# Patient Record
Sex: Female | Born: 1935 | Race: Black or African American | Hispanic: No | Marital: Single | Smoking: Never smoker
Health system: Southern US, Community
[De-identification: ages and names within clinical notes are randomized; demographics above are authoritative.]

## PROBLEM LIST (undated history)

## (undated) DIAGNOSIS — M1990 Unspecified osteoarthritis, unspecified site: Secondary | ICD-10-CM

## (undated) DIAGNOSIS — N289 Disorder of kidney and ureter, unspecified: Secondary | ICD-10-CM

## (undated) DIAGNOSIS — I10 Essential (primary) hypertension: Secondary | ICD-10-CM

## (undated) DIAGNOSIS — E119 Type 2 diabetes mellitus without complications: Secondary | ICD-10-CM

## (undated) HISTORY — DX: Unspecified osteoarthritis, unspecified site: M19.90

## (undated) HISTORY — DX: Essential (primary) hypertension: I10

## (undated) HISTORY — DX: Disorder of kidney and ureter, unspecified: N28.9

## (undated) HISTORY — PX: TUMOR REMOVAL: SHX12

## (undated) HISTORY — DX: Type 2 diabetes mellitus without complications: E11.9

---

## 1998-04-07 ENCOUNTER — Encounter: Payer: Self-pay | Admitting: Emergency Medicine

## 1998-04-07 ENCOUNTER — Emergency Department (HOSPITAL_COMMUNITY): Admission: EM | Admit: 1998-04-07 | Discharge: 1998-04-07 | Payer: Self-pay | Admitting: Emergency Medicine

## 1998-10-18 ENCOUNTER — Emergency Department (HOSPITAL_COMMUNITY): Admission: EM | Admit: 1998-10-18 | Discharge: 1998-10-18 | Payer: Self-pay | Admitting: Emergency Medicine

## 1998-10-18 ENCOUNTER — Encounter: Payer: Self-pay | Admitting: Emergency Medicine

## 1999-01-26 ENCOUNTER — Emergency Department (HOSPITAL_COMMUNITY): Admission: EM | Admit: 1999-01-26 | Discharge: 1999-01-26 | Payer: Self-pay | Admitting: Emergency Medicine

## 1999-02-28 ENCOUNTER — Ambulatory Visit (HOSPITAL_COMMUNITY): Admission: RE | Admit: 1999-02-28 | Discharge: 1999-02-28 | Payer: Self-pay | Admitting: Internal Medicine

## 2000-03-03 ENCOUNTER — Ambulatory Visit (HOSPITAL_COMMUNITY): Admission: RE | Admit: 2000-03-03 | Discharge: 2000-03-03 | Payer: Self-pay | Admitting: Internal Medicine

## 2004-04-12 ENCOUNTER — Ambulatory Visit: Payer: Self-pay | Admitting: Internal Medicine

## 2004-05-09 ENCOUNTER — Ambulatory Visit: Payer: Self-pay | Admitting: Internal Medicine

## 2004-08-17 ENCOUNTER — Ambulatory Visit: Payer: Self-pay | Admitting: Internal Medicine

## 2004-08-31 ENCOUNTER — Ambulatory Visit: Payer: Self-pay | Admitting: Internal Medicine

## 2004-10-31 ENCOUNTER — Ambulatory Visit: Payer: Self-pay | Admitting: Internal Medicine

## 2005-02-08 ENCOUNTER — Ambulatory Visit: Payer: Self-pay | Admitting: Internal Medicine

## 2005-02-15 ENCOUNTER — Ambulatory Visit: Payer: Self-pay | Admitting: Internal Medicine

## 2006-01-02 ENCOUNTER — Ambulatory Visit: Payer: Self-pay | Admitting: Internal Medicine

## 2006-02-03 ENCOUNTER — Ambulatory Visit: Payer: Self-pay | Admitting: Pulmonary Disease

## 2006-05-08 ENCOUNTER — Ambulatory Visit: Payer: Self-pay | Admitting: Internal Medicine

## 2006-10-29 ENCOUNTER — Ambulatory Visit: Payer: Self-pay | Admitting: Internal Medicine

## 2006-10-29 LAB — CONVERTED CEMR LAB
ALT: 13 units/L (ref 0–40)
AST: 19 units/L (ref 0–37)
Albumin: 3.9 g/dL (ref 3.5–5.2)
Alkaline Phosphatase: 73 units/L (ref 39–117)
BUN: 29 mg/dL — ABNORMAL HIGH (ref 6–23)
Bilirubin, Direct: 0.1 mg/dL (ref 0.0–0.3)
CO2: 28 meq/L (ref 19–32)
Calcium: 9.7 mg/dL (ref 8.4–10.5)
Chloride: 103 meq/L (ref 96–112)
Creatinine, Ser: 1.5 mg/dL — ABNORMAL HIGH (ref 0.4–1.2)
GFR calc Af Amer: 42 mL/min
GFR calc non Af Amer: 34 mL/min
Glucose, Bld: 150 mg/dL — ABNORMAL HIGH (ref 70–99)
Hgb A1c MFr Bld: 6.9 % — ABNORMAL HIGH (ref 4.6–6.0)
Potassium: 3.6 meq/L (ref 3.5–5.1)
Pro B Natriuretic peptide (BNP): 92 pg/mL (ref 0.0–100.0)
Sodium: 140 meq/L (ref 135–145)
TSH: 2.65 microintl units/mL (ref 0.35–5.50)
Total Bilirubin: 0.6 mg/dL (ref 0.3–1.2)
Total Protein: 7.6 g/dL (ref 6.0–8.3)

## 2007-01-15 ENCOUNTER — Ambulatory Visit: Payer: Self-pay | Admitting: Internal Medicine

## 2007-06-12 DIAGNOSIS — E119 Type 2 diabetes mellitus without complications: Secondary | ICD-10-CM

## 2007-06-12 DIAGNOSIS — N259 Disorder resulting from impaired renal tubular function, unspecified: Secondary | ICD-10-CM | POA: Insufficient documentation

## 2007-06-12 DIAGNOSIS — I10 Essential (primary) hypertension: Secondary | ICD-10-CM | POA: Insufficient documentation

## 2007-08-26 ENCOUNTER — Encounter: Payer: Self-pay | Admitting: Internal Medicine

## 2007-10-12 ENCOUNTER — Encounter: Admission: RE | Admit: 2007-10-12 | Discharge: 2007-10-12 | Payer: Self-pay | Admitting: Orthopedic Surgery

## 2007-10-21 ENCOUNTER — Encounter: Payer: Self-pay | Admitting: Internal Medicine

## 2007-10-23 ENCOUNTER — Encounter: Payer: Self-pay | Admitting: Internal Medicine

## 2007-11-17 ENCOUNTER — Ambulatory Visit: Payer: Self-pay | Admitting: Internal Medicine

## 2007-11-17 DIAGNOSIS — R609 Edema, unspecified: Secondary | ICD-10-CM

## 2007-11-17 DIAGNOSIS — M1990 Unspecified osteoarthritis, unspecified site: Secondary | ICD-10-CM | POA: Insufficient documentation

## 2007-11-18 LAB — CONVERTED CEMR LAB
BUN: 32 mg/dL — ABNORMAL HIGH (ref 6–23)
Basophils Absolute: 0 10*3/uL (ref 0.0–0.1)
Basophils Relative: 0.1 % (ref 0.0–1.0)
CO2: 27 meq/L (ref 19–32)
Calcium: 9.8 mg/dL (ref 8.4–10.5)
Chloride: 103 meq/L (ref 96–112)
Creatinine, Ser: 1.3 mg/dL — ABNORMAL HIGH (ref 0.4–1.2)
Eosinophils Absolute: 0.1 10*3/uL (ref 0.0–0.7)
Eosinophils Relative: 1.5 % (ref 0.0–5.0)
GFR calc Af Amer: 49 mL/min
GFR calc non Af Amer: 41 mL/min
Glucose, Bld: 170 mg/dL — ABNORMAL HIGH (ref 70–99)
HCT: 36.1 % (ref 36.0–46.0)
Hemoglobin: 12.2 g/dL (ref 12.0–15.0)
Hgb A1c MFr Bld: 6.9 % — ABNORMAL HIGH (ref 4.6–6.0)
Lymphocytes Relative: 25.4 % (ref 12.0–46.0)
MCHC: 33.8 g/dL (ref 30.0–36.0)
MCV: 90.4 fL (ref 78.0–100.0)
Monocytes Absolute: 0.4 10*3/uL (ref 0.1–1.0)
Monocytes Relative: 5.8 % (ref 3.0–12.0)
Neutro Abs: 4.3 10*3/uL (ref 1.4–7.7)
Neutrophils Relative %: 67.2 % (ref 43.0–77.0)
Platelets: 231 10*3/uL (ref 150–400)
Potassium: 3.9 meq/L (ref 3.5–5.1)
Pro B Natriuretic peptide (BNP): 64 pg/mL (ref 0.0–100.0)
RBC: 3.99 M/uL (ref 3.87–5.11)
RDW: 13.8 % (ref 11.5–14.6)
Sodium: 140 meq/L (ref 135–145)
TSH: 2.41 microintl units/mL (ref 0.35–5.50)
WBC: 6.4 10*3/uL (ref 4.5–10.5)

## 2007-11-24 ENCOUNTER — Encounter: Payer: Self-pay | Admitting: Internal Medicine

## 2007-12-16 ENCOUNTER — Encounter: Payer: Self-pay | Admitting: Internal Medicine

## 2008-01-10 ENCOUNTER — Encounter: Payer: Self-pay | Admitting: Internal Medicine

## 2008-01-13 ENCOUNTER — Telehealth (INDEPENDENT_AMBULATORY_CARE_PROVIDER_SITE_OTHER): Payer: Self-pay | Admitting: *Deleted

## 2008-01-20 ENCOUNTER — Encounter: Payer: Self-pay | Admitting: Internal Medicine

## 2008-03-03 ENCOUNTER — Telehealth (INDEPENDENT_AMBULATORY_CARE_PROVIDER_SITE_OTHER): Payer: Self-pay | Admitting: *Deleted

## 2008-03-16 ENCOUNTER — Encounter: Payer: Self-pay | Admitting: Internal Medicine

## 2008-05-17 ENCOUNTER — Encounter: Payer: Self-pay | Admitting: Internal Medicine

## 2008-07-20 ENCOUNTER — Encounter: Payer: Self-pay | Admitting: Internal Medicine

## 2008-08-23 ENCOUNTER — Telehealth (INDEPENDENT_AMBULATORY_CARE_PROVIDER_SITE_OTHER): Payer: Self-pay | Admitting: *Deleted

## 2008-08-23 ENCOUNTER — Ambulatory Visit: Payer: Self-pay | Admitting: Internal Medicine

## 2008-08-23 DIAGNOSIS — M2550 Pain in unspecified joint: Secondary | ICD-10-CM | POA: Insufficient documentation

## 2008-08-24 ENCOUNTER — Encounter: Payer: Self-pay | Admitting: Internal Medicine

## 2008-08-24 LAB — CONVERTED CEMR LAB
BUN: 19 mg/dL (ref 6–23)
Basophils Relative: 3 % (ref 0.0–3.0)
CO2: 28 meq/L (ref 19–32)
Chloride: 106 meq/L (ref 96–112)
Creatinine, Ser: 1.3 mg/dL — ABNORMAL HIGH (ref 0.4–1.2)
Eosinophils Absolute: 0 10*3/uL (ref 0.0–0.7)
Lymphs Abs: 1.6 10*3/uL (ref 0.7–4.0)
MCHC: 34.3 g/dL (ref 30.0–36.0)
MCV: 92.4 fL (ref 78.0–100.0)
Monocytes Absolute: 0.2 10*3/uL (ref 0.1–1.0)
Neutrophils Relative %: 60.2 % (ref 43.0–77.0)
RBC: 4.02 M/uL (ref 3.87–5.11)
Sed Rate: 28 mm/hr — ABNORMAL HIGH (ref 0–22)

## 2008-09-03 ENCOUNTER — Encounter: Payer: Self-pay | Admitting: Internal Medicine

## 2008-09-13 ENCOUNTER — Encounter: Payer: Self-pay | Admitting: Internal Medicine

## 2008-09-30 ENCOUNTER — Encounter: Payer: Self-pay | Admitting: Internal Medicine

## 2008-10-06 ENCOUNTER — Telehealth (INDEPENDENT_AMBULATORY_CARE_PROVIDER_SITE_OTHER): Payer: Self-pay | Admitting: *Deleted

## 2008-10-11 ENCOUNTER — Telehealth: Payer: Self-pay | Admitting: Internal Medicine

## 2008-10-12 ENCOUNTER — Ambulatory Visit: Payer: Self-pay | Admitting: Internal Medicine

## 2008-10-13 LAB — CONVERTED CEMR LAB
BUN: 30 mg/dL — ABNORMAL HIGH (ref 6–23)
Basophils Absolute: 0 10*3/uL (ref 0.0–0.1)
Basophils Relative: 0.1 % (ref 0.0–3.0)
CO2: 28 meq/L (ref 19–32)
Chloride: 105 meq/L (ref 96–112)
Eosinophils Absolute: 0 10*3/uL (ref 0.0–0.7)
Glucose, Bld: 132 mg/dL — ABNORMAL HIGH (ref 70–99)
HCT: 35.5 % — ABNORMAL LOW (ref 36.0–46.0)
Hemoglobin: 12.2 g/dL (ref 12.0–15.0)
Lymphs Abs: 0.8 10*3/uL (ref 0.7–4.0)
MCHC: 34.3 g/dL (ref 30.0–36.0)
MCV: 94.3 fL (ref 78.0–100.0)
Neutro Abs: 7.2 10*3/uL (ref 1.4–7.7)
Potassium: 4.5 meq/L (ref 3.5–5.1)
RDW: 14.9 % — ABNORMAL HIGH (ref 11.5–14.6)

## 2008-10-25 ENCOUNTER — Encounter: Payer: Self-pay | Admitting: Internal Medicine

## 2008-11-14 ENCOUNTER — Telehealth (INDEPENDENT_AMBULATORY_CARE_PROVIDER_SITE_OTHER): Payer: Self-pay | Admitting: *Deleted

## 2008-11-24 ENCOUNTER — Encounter: Payer: Self-pay | Admitting: Internal Medicine

## 2009-01-02 ENCOUNTER — Telehealth (INDEPENDENT_AMBULATORY_CARE_PROVIDER_SITE_OTHER): Payer: Self-pay | Admitting: *Deleted

## 2009-01-06 ENCOUNTER — Encounter: Payer: Self-pay | Admitting: Internal Medicine

## 2009-03-17 ENCOUNTER — Encounter: Payer: Self-pay | Admitting: Critical Care Medicine

## 2009-04-28 ENCOUNTER — Telehealth: Payer: Self-pay | Admitting: Internal Medicine

## 2009-05-04 ENCOUNTER — Inpatient Hospital Stay (HOSPITAL_COMMUNITY): Admission: EM | Admit: 2009-05-04 | Discharge: 2009-05-08 | Payer: Self-pay | Admitting: Emergency Medicine

## 2009-07-28 ENCOUNTER — Telehealth (INDEPENDENT_AMBULATORY_CARE_PROVIDER_SITE_OTHER): Payer: Self-pay | Admitting: *Deleted

## 2010-06-26 NOTE — Progress Notes (Signed)
Summary: ORDER  Phone Note Call from Patient Call back at [phone]   Caller: Daughter-BESSIE Call For: WERT Summary of Call: PT NEED ORDER TO HAVE WHEEL CHAIR PICKED UP PT IN THE HOSPITAL Initial call taken by: Rickard Patience,  July 28, 2009 3:29 PM  Follow-up for Phone Call        PT'S DAUGHTER THOUGHT THE CHAIR WAS A RENTAL EXPLAINED TO HER THIS IS A PURCHASE BILLED TO [facility] OVER SEVERAL MONTHS--ADVISED HER TO KEEP THE CHAIR Follow-up by: Philipp Deputy CMA,  July 28, 2009 5:23 PM

## 2010-08-28 LAB — BASIC METABOLIC PANEL
CO2: 20 mEq/L (ref 19–32)
CO2: 23 mEq/L (ref 19–32)
Chloride: 108 mEq/L (ref 96–112)
Chloride: 108 mEq/L (ref 96–112)
Chloride: 116 mEq/L — ABNORMAL HIGH (ref 96–112)
Creatinine, Ser: 1.05 mg/dL (ref 0.4–1.2)
GFR calc Af Amer: 39 mL/min — ABNORMAL LOW (ref >60)
GFR calc Af Amer: 59 mL/min — ABNORMAL LOW (ref >60)
GFR calc non Af Amer: 33 mL/min — ABNORMAL LOW (ref >60)
Glucose, Bld: 139 mg/dL — ABNORMAL HIGH (ref 70–99)
Potassium: 3.7 mEq/L (ref 3.5–5.1)
Sodium: 140 mEq/L (ref 135–145)
Sodium: 140 mEq/L (ref 135–145)

## 2010-08-28 LAB — CBC
HCT: 39.7 % (ref 36.0–46.0)
HCT: 41.5 % (ref 36.0–46.0)
Hemoglobin: 13.2 g/dL (ref 12.0–15.0)
MCHC: 33.3 g/dL (ref 30.0–36.0)
MCHC: 33.8 g/dL (ref 30.0–36.0)
MCV: 93 fL (ref 78.0–100.0)
MCV: 94.3 fL (ref 78.0–100.0)
MCV: 94.9 fL (ref 78.0–100.0)
Platelets: 280 10*3/uL (ref 150–400)
RBC: 3.69 MIL/uL — ABNORMAL LOW (ref 3.87–5.11)
RBC: 4.21 MIL/uL (ref 3.87–5.11)
RDW: 14.1 % (ref 11.5–15.5)
WBC: 7.6 10*3/uL (ref 4.0–10.5)

## 2010-08-28 LAB — COMPREHENSIVE METABOLIC PANEL
ALT: 16 U/L (ref 0–35)
BUN: 53 mg/dL — ABNORMAL HIGH (ref 6–23)
CO2: 21 mEq/L (ref 19–32)
Calcium: 9.6 mg/dL (ref 8.4–10.5)
Creatinine, Ser: 1.53 mg/dL — ABNORMAL HIGH (ref 0.4–1.2)
GFR calc non Af Amer: 32 mL/min — ABNORMAL LOW (ref >60)
Glucose, Bld: 185 mg/dL — ABNORMAL HIGH (ref 70–99)
Sodium: 134 mEq/L — ABNORMAL LOW (ref 135–145)

## 2010-08-28 LAB — URINE CULTURE: Colony Count: >=100000

## 2010-08-28 LAB — GLUCOSE, CAPILLARY
Glucose-Capillary: 102 mg/dL — ABNORMAL HIGH (ref 70–99)
Glucose-Capillary: 113 mg/dL — ABNORMAL HIGH (ref 70–99)
Glucose-Capillary: 120 mg/dL — ABNORMAL HIGH (ref 70–99)
Glucose-Capillary: 124 mg/dL — ABNORMAL HIGH (ref 70–99)
Glucose-Capillary: 136 mg/dL — ABNORMAL HIGH (ref 70–99)
Glucose-Capillary: 139 mg/dL — ABNORMAL HIGH (ref 70–99)
Glucose-Capillary: 175 mg/dL — ABNORMAL HIGH (ref 70–99)

## 2010-08-28 LAB — URINALYSIS, ROUTINE W REFLEX MICROSCOPIC
Bilirubin Urine: NEGATIVE
Hgb urine dipstick: NEGATIVE
Ketones, ur: NEGATIVE mg/dL
Nitrite: NEGATIVE
Urobilinogen, UA: 0.2 mg/dL (ref 0.0–1.0)

## 2010-08-28 LAB — LIPASE, BLOOD: Lipase: 18 U/L (ref 11–59)

## 2010-08-28 LAB — DIFFERENTIAL
Basophils Absolute: 0 10*3/uL (ref 0.0–0.1)
Basophils Relative: 0 % (ref 0–1)
Eosinophils Absolute: 0 10*3/uL (ref 0.0–0.7)
Eosinophils Relative: 0 % (ref 0–5)
Monocytes Absolute: 0.2 10*3/uL (ref 0.1–1.0)

## 2010-08-28 LAB — HEMOGLOBIN A1C: Hgb A1c MFr Bld: 6.1 % (ref 4.6–6.1)

## 2010-08-28 LAB — URINE MICROSCOPIC-ADD ON

## 2010-08-28 LAB — LACTIC ACID, PLASMA: Lactic Acid, Venous: 2 mmol/L (ref 0.5–2.2)

## 2010-10-09 NOTE — Assessment & Plan Note (Signed)
[facility]                                 ON-CALL NOTE   ALECEA, TREGO                       MRN:          [id number]  DATE:10/11/2008                            DOB:          1911/12/17    PRIMARY CARE PHYSICIAN:  Casimiro Needle B. Sherene Sires, MD, FCCP   At approximately 1950 hours, Oct 11, 2008, Ms. Soyla Murphy, a  nurse's aide called and said that Ms. Keshana Klemz had inadvertently  taken an additional glyburide metformin tablet.  She had earlier had  some hypoglycemia and had been told to discontinue these medications.  At the present time, she was well.  The family was feeding her and they  wanted to make sure that they should continue with their planned meeting  or appointment with Dr. Sherene Sires, the following morning.  I instructed them  to continue to be vigilant for hypoglycemia.  If they have any  questions, call back or to take her to the emergency room and by all  means, follow up with her appointment.     Rogelia Rohrer, MD  Electronically Signed    Clemetine Marker  DD: 10/11/2008  DT: 10/12/2008  Job #: [id number]

## 2010-10-09 NOTE — Assessment & Plan Note (Signed)
[facility]                             PULMONARY OFFICE NOTE   CORDELL, COKE                       MRN:          [id number]  DATE:01/15/2007                            DOB:          [birth year]    PRIMARY SERVICE OFFICE VISIT   HISTORY:  A 77 year old black female with hypertension and diabetes in  for renewal of her FL2 for CAP.  She is living in her own home with an  aide who comes in 6 hours a day and a nurse who visits once a month.  Her daughter has been organizing her medicines into daily medicines in a  pill box that has only 1 box per day, but the patient is familiar with  which medicines she takes in the evening (Glucovance) and I reviewed  each and every one of these medications with her and her daughter  reconciling the bottles and the list, which is 100% correct in the  column dated January 15, 2007.   The daughter reports a fasting blood sugar typically between 100 and  120, and note that the patient had a hemoglobin A1c level in June of  this year of 6.9.  The patient denies any polyuria or polydipsia, chest  pain, dyspnea, orthopnea, PND, or leg swelling.   PHYSICAL EXAMINATION:  She is a wheelchair-bound pleasant black female  who lets her daughter do most of the talking for her.  VITAL SIGNS:  Stable vital signs.  HEENT:  Unremarkable.  Oropharynx is clear.  LUNGS:  The lung fields are clear bilaterally to auscultation and  percussion.  CARDIAC:  Regular rate and rhythm without murmur, gallop, or rub.  ABDOMEN:  Soft and benign.  EXTREMITIES:  Warm without calf tenderness, cyanosis, clubbing, or  edema.   IMPRESSION:  1. Diabetes appears to be under good control on her present regimen,      which I did not change.  I did recommend repeat fasting blood sugar      and a hemoglobin A1c in 3 months.  2. Hypertension well controlled on her present regimen.  3. General geriatric decline.  She appears well compensated in her     present home environment, and I have filled out all of the      paperwork for her forms for continued home care.     Charlaine Dalton. Sherene Sires, MD, [facility]  Electronically Signed    MBW/MedQ  DD: 01/15/2007  DT: 01/16/2007  Job #: [id number]

## 2010-10-09 NOTE — Assessment & Plan Note (Signed)
[facility]                             PULMONARY OFFICE NOTE   CLIFFIE, GINGRAS                       MRN:          [id number]  DATE:10/29/2006                            DOB:          02-Aug-1911    PRIMARY SERVICE/EXTENDED OFFICE VISIT:  A 77 year old black female with  hypertension and diabetes, in complaining of shoulders aching and leg  swelling.  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  The right is worse than the left.  She denies any jaw claudication or  other myalgias or arthralgias.  She denies any worsening with cough or  activity.   In addition, she has noticed increasing leg swelling over the last  several months.  She has apparently been taking Anacin for the shoulder  pain.  Denies using nonsteroidals.  She denies any orthopnea, PND, or  significant cough.   For a full inventory of medications, please see face sheet column dated  October 29, 2006, but I do not have confidence that she is actually using  the medications as instructed because she uses her medication pill box  but her caretaker is not correlating with the written instructions (see  comments below).   PHYSICAL EXAMINATION:  GENERAL:  She is an obese, wheelchair-bound black  female in no acute distress.  VITAL SIGNS:  Stable vital signs.  Blood pressure of 118/66.  HEENT:  Unremarkable.  NECK:  Clear.  CHEST:  Lung fields are clear bilaterally to auscultation and  percussion.  MUSCULOSKELETAL:  There is classic crepitus with pain elicited with  trying to abduct either arm beyond a 45-degree angle, especially on the  right.  She also had pain on external rotation.  CARDIAC:  Regular rate and rhythm without murmur, gallop, or rub.  ABDOMEN:  Soft, benign.  EXTREMITIES:  Warm without calf tenderness, cyanosis, or  clubbing.  There was 1+ pitting.   IMPRESSION:  1. Classic degenerative arthritis of the shoulders with crepitus and      restriction of the shoulder joints.  She does not have any evidence      at all of a systemic collagen vascular disease, and therefore I am      going to recommend at this point Tylenol p.r.n. and avoid all      nonsteroidals based on problem #2.  2. Diabetes, complicated by mild renal insufficiency with a creatinine      of 1.3.  We need to recheck a BMET today and avoid nonsteroidals      (except for perhaps Clinoril if the pain does not respond to      Tylenol) and/or refer her back to [facility], where she      has previously seen Dr. Montez Morita for similar problems.  3. Adult-onset diabetes mellitus.  We will check a hemoglobin A1c      today along with a nonfasting blood sugar.  4. Hypertension is well-controlled on her present regimen.  5. Peripheral edema if of unclear etiology.  We will check a BNP and      also an albumin and TSH level.   Overall I am concerned about this patent's state of decline and am not  confident that the caretaker that stays with her during the day has  enough insight into her medications to make sure she is not making  medication errors.  For that reason I have recommended that the patient  return with her pill box with all of her medications and with her  medication sheet and her caretaker at 3 months, sooner if needed.     Charlaine Dalton. Sherene Sires, MD, [facility]  Electronically Signed    MBW/MedQ  DD: 10/29/2006  DT: 10/29/2006  Job #: [id number]

## 2010-10-12 NOTE — Assessment & Plan Note (Signed)
[facility]                               PULMONARY OFFICE NOTE   JUSTICE, AGUIRRE                       MRN:          [id number]  DATE:01/02/2006                            DOB:          1911-12-03    HISTORY:  A 77 year old black female with  hypertension and diabetes, who  has not been seen in the office in almost a year.  She came in for follow up  today with no specific complaints, denying any significant polyuria,  dyspnea, orthopnea, PND, chest pain, or leg swelling.   For a full inventory of medications please see face sheet, 01/02/2006, but  note there are several problems.  First, this patient was provided with a  list of medicines in the calendar that she has lost.  Secondly, she is  putting her medicines in a plastic container that only has slot per day,  whereas at least one of her medicines, Glucovance, is listed as twice daily.   PHYSICAL EXAMINATION:  GENERAL:  She is a pleasant black female in a  wheelchair in no acute distress.  VITAL SIGNS:  Blood pressure 130/80.  HEENT:  Unremarkable.  Pharynx clear.  LUNGS:  Lung fields are completely clear bilaterally to auscultation and  percussion.  CARDIAC:  Regular rhythm without murmur.  ABDOMEN:  Soft and benign.  EXTREMITIES:  Warm, without calf tenderness, cyanosis, clubbing, or edema.   IMPRESSION:  Hypertension and diabetes appear to be well compensated  clinically, and I believe loose control is reasonable here given the  difficulty with adherence issues.  She has been dependent on her daughter,  who apparently is no longer involved in her care and brings with her a care  worker, which is going to begin to come out to the house.  I have asked the  care worker to purchase for her a plastic pill container that correlates  with the medication counter that we previously provided her, namely having  at least two times during the day when medications are provided should mean  that  she has two slots per day.   I did do a chemistry profile, CBC, TSH and hemoglobin A1C to complete her  workup today and we will ask her to come back and see our nurse practitioner  within the next four weeks to review these labs and also to generate a new  medication counter that is written in the most unambiguous and user friendly  terms as possible (avoiding using trade name for generics listed on the  bottles so there is 100% correlation between the bottles and the medication  counter).                                   Charlaine Dalton. Sherene Sires, MD, [facility]   MBW/MedQ  DD:  01/02/2006  DT:  01/02/2006  Job #:  [id number]

## 2010-10-12 NOTE — Assessment & Plan Note (Signed)
 [facility]ALTH[facility]                               PULMONARY OFFICE NOTE   Heather [name], Heather [name]                       MRN:          [id number]  DATE:02/03/2006                            DOB:          Jan 09, 1912    HISTORY OF PRESENT ILLNESS:  The patient is a 77 year old, African-American  female patient of Dr. Thurston Hole who has a known history of hypertension and  diabetes mellitus.  The patient presents for a 87-month followup today.  Last  visit, the patient underwent lab work which showed a hemoglobin A1c at 6.3.  Thyroid was normal at 3.30.  Since last visit, the patient reports she has  been doing well without any difficulties.  The patient has brought all of  her medications in today for review as well.  In fact, they do match our med  list.   PHYSICAL EXAMINATION:  GENERAL:  The patient is a pleasant, elderly female  in no acute distress.  VITAL SIGNS:  She is afebrile with stable vital signs.  HEENT:  Unremarkable.  NECK:  Supple without adenopathy.  LUNGS:  Lung sounds are clear.  CARDIAC:  Regular rate.  ABDOMEN:  Soft, nontender.  EXTREMITIES:  Warm without any edema.   IMPRESSION AND PLAN:  1. Diabetes mellitus, currently well-controlled.  The patient will      continue on her current regimen.  Continue on decreased sweet diet.      And, we will recheck here in 3 months or sooner if needed.  2. Hypertension, currently under good control.  We will continue on her      current regimen.  Follow back up as scheduled with Dr. Sherene Sires in 3 months      or sooner if needed.  3. Complex medication regimen.  The patient's medications were reviewed.      The patient education provided.  The computerized medication counter      was adjusted and reviewed in detail, and the patient is aware to bring      this back to each and every visit.                                   Heather Oaks, NP                                Charlaine Dalton. Sherene Sires, MD, [facility]   TP/MedQ  DD:  02/04/2006  DT:  02/05/2006  Job #:  [id number]

## 2010-10-12 NOTE — Assessment & Plan Note (Signed)
[facility]                             PULMONARY OFFICE NOTE   YASMEN, CORTNER                       MRN:          [id number]  DATE:05/08/2006                            DOB:          10-28-1911    HISTORY:  A 77 year old black female with hypertension and diabetes in  for followup. She apparently still does her own medicines using a pill  organizer and does have a caretaker that looks after her but does not  actually check on her medicine, which her daughter actually helps her  but has not been to the office to make sure she actually is taking the  medications as they are prescribed. When she checks her blood sugars  they are usually under 200 but it is not clear to me these are done  fasting. She denies any exertional chest pain, orthopnea, PND or leg  swelling.   PHYSICAL EXAMINATION:  GENERAL:  She is a frail, elderly, black female  sitting in a wheelchair in no acute distress.  VITAL SIGNS:  Blood pressure 146/86.  HEENT:  Unremarkable. Pharynx clear.  LUNGS:  Lung fields are completely clearly bilaterally to auscultation  and percussion.  HEART:  Regular rate and rhythm without murmur, gallop or rub.  ABDOMEN:  Soft, benign.  EXTREMITIES:  Warm without calf tenderness, cyanosis or clubbing. Trace  edema.   IMPRESSION:  1. Diabetes mellitus, appears adequately controlled although I am      concerned about the blood sugars reported to be in the 200 range (I      suspect these are post prandial and advised them just to check them      several times a week before breakfast). Will check a hemoglobin A1c      and a nonfasting blood sugar today.  2. Hypertension is well controlled with minimum peripheral edema, no      evidence of heart failure clinically at this point.   She seems to be doing relatively well with very limited resources and  therefore plan to use the strategy of leave well enough alone in this  particular regard but would  like her to bring back the pill organizer  with each visit so we can make sure she has got the right number of  pills in the right slots.     Charlaine Dalton. Sherene Sires, MD, [facility]  Electronically Signed    MBW/MedQ  DD: 05/08/2006  DT: 05/08/2006  Job #: [id number]

## 2010-12-26 IMAGING — CR DG ABDOMEN ACUTE W/ 1V CHEST
3 series · 3 of 3 positions shown · non-contrast
Comparison: Chest x-ray of 11/17/2007 and abdomen film of
10/12/2007

CLINICAL DATA: Vomiting, diarrhea

ACUTE ABDOMEN SERIES (ABDOMEN 2 VIEW & CHEST 1 VIEW)

[w abdomen decub]
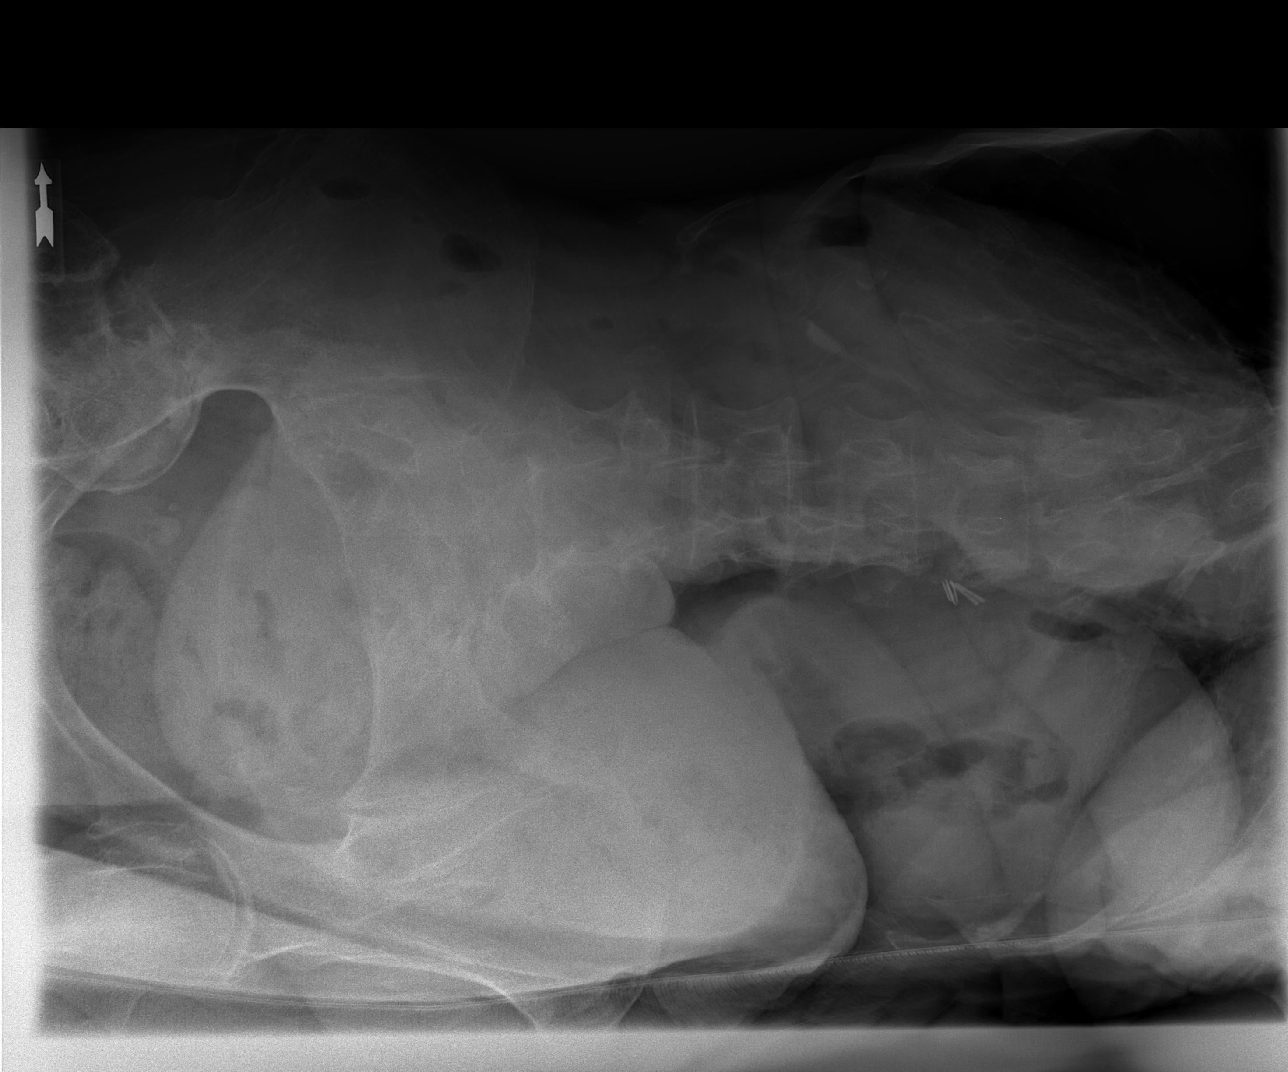

[view not recorded (1 of 2)]
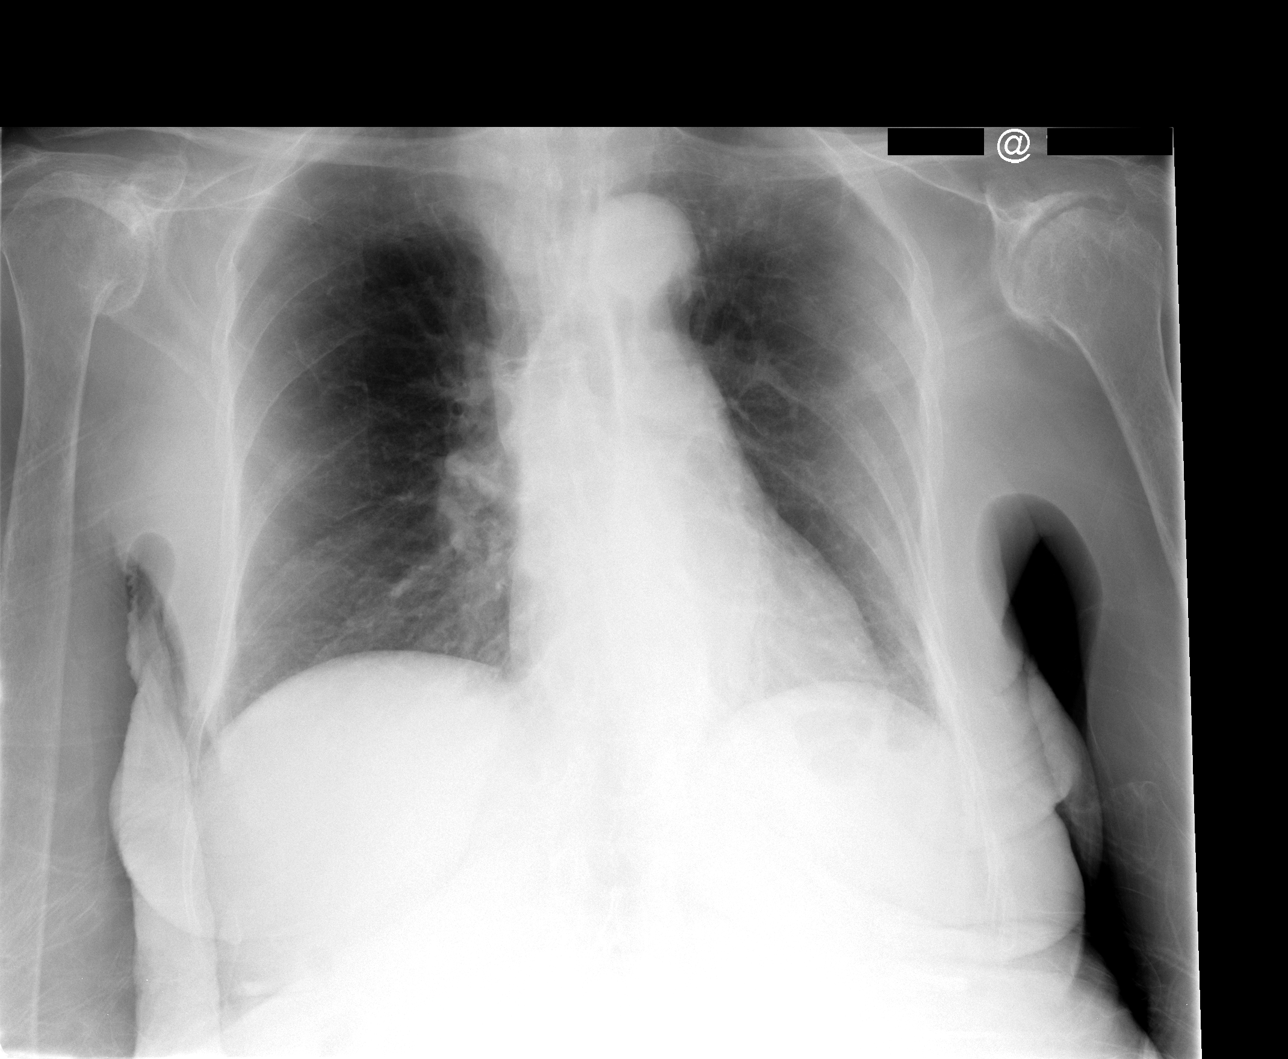

[view not recorded (2 of 2)]
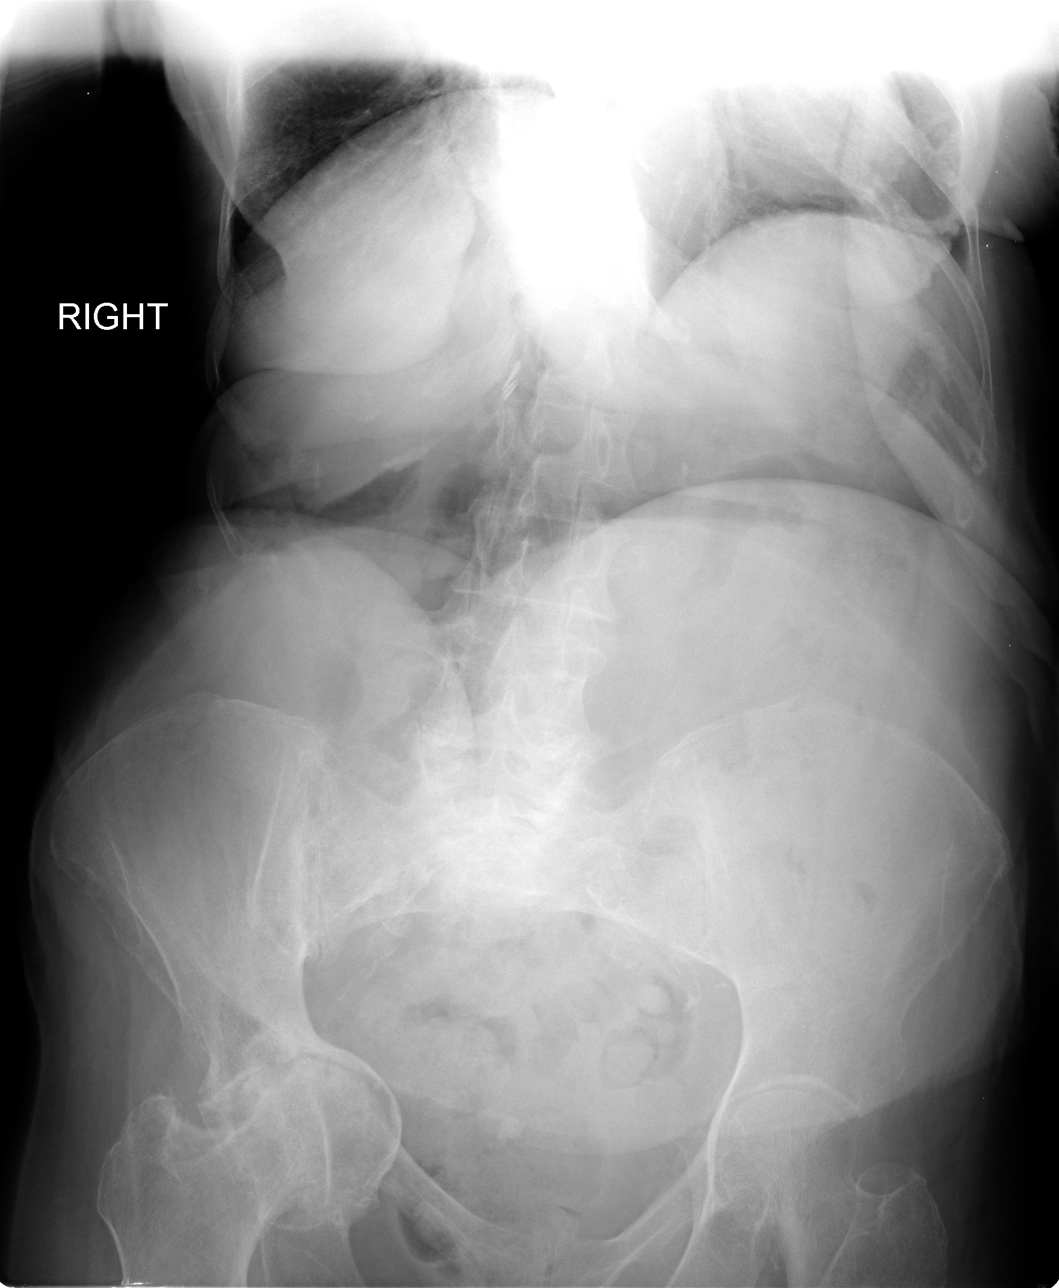

[3 of 3 positions shown; findings below may reference images not displayed]

FINDINGS: The lungs are clear and slightly hyperaerated.  The heart
is within normal limits in size.  There are degenerative changes
noted in the shoulders.  No acute bony abnormality is seen.

Supine and left lateral decubitus films of the abdomen show no
bowel obstruction.  No free air is seen.  There is a lumbar
scoliosis present convex to the left.  Considerable degenerative
change is noted in the right hip with protrusion present.
IMPRESSION: 1.  No active lung disease.  Hyperaeration.
2.  No bowel obstruction or free air.
3.  Marked degenerative change in the right hip.

## 2012-09-04 ENCOUNTER — Non-Acute Institutional Stay (SKILLED_NURSING_FACILITY): Payer: Medicare Other | Admitting: Internal Medicine

## 2012-09-04 DIAGNOSIS — F0280 Dementia in other diseases classified elsewhere without behavioral disturbance: Secondary | ICD-10-CM

## 2012-09-04 DIAGNOSIS — I10 Essential (primary) hypertension: Secondary | ICD-10-CM

## 2012-09-04 DIAGNOSIS — E876 Hypokalemia: Secondary | ICD-10-CM

## 2012-09-04 DIAGNOSIS — E119 Type 2 diabetes mellitus without complications: Secondary | ICD-10-CM

## 2012-09-23 NOTE — Progress Notes (Signed)
Patient ID: Heather [name], female   DOB: [birth year], 79 y.o.   MRN: [id number]        PROGRESS NOTE  DATE:  09/04/2012  FACILITY: [facility]   LEVEL OF CARE: SNF  Routine Visit  CHIEF COMPLAINT:  Manage hypertension and diabetes mellitus.   HISTORY OF PRESENT ILLNESS:  REASSESSMENT OF ONGOING PROBLEM(S):  DM:pt's DM remains stable.  Staff denies polyuria, polydipsia, polyphagia, changes in vision or hypoglycemic episodes.  No complications noted from the medication presently being used.  Last hemoglobin A1c is:  5.8 in 04/2012.  HTN: Pt 's HTN remains stable.  Staff denies CP, sob, DOE, pedal edema, headaches, dizziness or visual disturbances.  No complications from the medications currently being used.  Last BP :  130/64, 120/68, 142/70.  PAST MEDICAL HISTORY : Reviewed.  No changes.  CURRENT MEDICATIONS: Reviewed per [facility]  REVIEW OF SYSTEMS:  Unobtainable due to dementia.   PHYSICAL EXAMINATION  VS:  T 97.9       P  66    RR 17    BP 130/64    POX %     WT (Lb) 148  GENERAL: no acute distress, normal body habitus NECK: supple, trachea midline, no neck masses, no thyroid tenderness, no thyromegaly RESPIRATORY: breathing is even & unlabored, BS CTAB CARDIAC: RRR, no murmur,no extra heart sounds EDEMA/VARICOSITIES:  +2 bilateral lower extremity edema  ARTERIAL:  pedal pulses nonpalpable  GI: abdomen soft, normal BS, no masses, no tenderness, no hepatomegaly, no splenomegaly PSYCHIATRIC: the patient is alert & oriented to person, affect & behavior appropriate  LABS/RADIOLOGY: 07/2012:  Liver profile normal.    05/2012:  Creatinine 1.18, otherwise BMP normal.   04/2012:  CBC normal.  ASSESSMENT/PLAN:  Diabetes mellitus.  Well controlled.    Hypertension.  Well controlled.    Alzheimer's dementia.  Advanced.    Hypokalemia.  Well repleted.  Continue supplementation.   Constipation.  Adequately controlled.   Osteoarthritis.  No active issues.   CPT CODE:  40981

## 2012-09-30 ENCOUNTER — Non-Acute Institutional Stay (SKILLED_NURSING_FACILITY): Payer: Medicare Other | Admitting: Internal Medicine

## 2012-09-30 DIAGNOSIS — G309 Alzheimer's disease, unspecified: Secondary | ICD-10-CM

## 2012-09-30 DIAGNOSIS — E876 Hypokalemia: Secondary | ICD-10-CM

## 2012-09-30 DIAGNOSIS — I10 Essential (primary) hypertension: Secondary | ICD-10-CM

## 2012-09-30 DIAGNOSIS — F0280 Dementia in other diseases classified elsewhere without behavioral disturbance: Secondary | ICD-10-CM

## 2012-09-30 DIAGNOSIS — E119 Type 2 diabetes mellitus without complications: Secondary | ICD-10-CM

## 2012-10-02 DIAGNOSIS — F0280 Dementia in other diseases classified elsewhere without behavioral disturbance: Secondary | ICD-10-CM | POA: Insufficient documentation

## 2012-10-02 DIAGNOSIS — E876 Hypokalemia: Secondary | ICD-10-CM | POA: Insufficient documentation

## 2012-10-02 DIAGNOSIS — I10 Essential (primary) hypertension: Secondary | ICD-10-CM | POA: Insufficient documentation

## 2012-10-02 NOTE — Progress Notes (Signed)
Patient ID: Heather [name], female   DOB: 04-01-1912, 79 y.o.   MRN: [id number]        PROGRESS NOTE  DATE:  09/30/2012  FACILITY: [facility]   LEVEL OF CARE: SNF  Routine Visit  CHIEF COMPLAINT:  Manage hypertension and diabetes mellitus.   HISTORY OF PRESENT ILLNESS:  REASSESSMENT OF ONGOING PROBLEM(S):  DM:pt's DM remains stable.  Staff deny polyuria, polydipsia, polyphagia, changes in vision or hypoglycemic episodes.  No complications noted from the medication presently being used.  Last hemoglobin A1c is:  5.8 in 04/2012.  HTN: Pt 's HTN remains stable.  Staff deny CP, sob, DOE, pedal edema, headaches, dizziness or visual disturbances.  No complications from the medications currently being used.  Last BP :120/70,  130/64, 120/68, 142/70.  PAST MEDICAL HISTORY : Reviewed.  No changes.  CURRENT MEDICATIONS: Reviewed per [facility]  REVIEW OF SYSTEMS:  Unobtainable due to dementia.   PHYSICAL EXAMINATION  VS:  T 98.7.      P  68  RR 19   BP 120/70    POX %     WT (Lb) 149  GENERAL: no acute distress, normal body habitus NECK: supple, trachea midline, no neck masses, no thyroid tenderness, no thyromegaly RESPIRATORY: breathing is even & unlabored, BS CTAB CARDIAC: RRR, no murmur,no extra heart sounds EDEMA/VARICOSITIES:  +2 bilateral lower extremity edema  ARTERIAL:  pedal pulses nonpalpable  GI: abdomen soft, normal BS, no masses, no tenderness, no hepatomegaly, no splenomegaly PSYCHIATRIC: the patient is alert & oriented to person, affect & behavior appropriate  LABS/RADIOLOGY: 07/2012:  Liver profile normal.    05/2012:  Creatinine 1.18, otherwise BMP normal.   04/2012:  CBC normal.  ASSESSMENT/PLAN:  Diabetes mellitus.  Well controlled.    Hypertension.  Well controlled.    Alzheimer's dementia.  Advanced.    Hypokalemia.  Well repleted.  Continue supplementation.   Constipation.  Adequately controlled.   Osteoarthritis.  No active issues.   CPT CODE:  78295

## 2012-10-28 ENCOUNTER — Non-Acute Institutional Stay (SKILLED_NURSING_FACILITY): Payer: Medicare Other | Admitting: Internal Medicine

## 2012-10-28 DIAGNOSIS — E119 Type 2 diabetes mellitus without complications: Secondary | ICD-10-CM

## 2012-10-28 DIAGNOSIS — I10 Essential (primary) hypertension: Secondary | ICD-10-CM

## 2012-10-28 DIAGNOSIS — E876 Hypokalemia: Secondary | ICD-10-CM

## 2012-10-28 DIAGNOSIS — F0280 Dementia in other diseases classified elsewhere without behavioral disturbance: Secondary | ICD-10-CM

## 2012-10-28 NOTE — Progress Notes (Signed)
Patient ID: Heather [name], female   DOB: Jul 25, 1911, 79 y.o.   MRN: [id number]        PROGRESS NOTE  DATE:  10/28/2012  FACILITY: [facility]   LEVEL OF CARE: SNF  Routine Visit  CHIEF COMPLAINT:  Manage hypertension and diabetes mellitus.   HISTORY OF PRESENT ILLNESS:  REASSESSMENT OF ONGOING PROBLEM(S):  DM:pt's DM remains stable.  Staff deny polyuria, polydipsia, polyphagia, changes in vision or hypoglycemic episodes.  No complications noted from the medication presently being used.  Last hemoglobin A1c is:  5.8 in 04/2012.  HTN: Pt 's HTN remains stable.  Staff deny CP, sob, DOE, pedal edema, headaches, dizziness or visual disturbances.  No complications from the medications currently being used.  Last BP : 120/70,  130/64, 120/68, 142/70.  PAST MEDICAL HISTORY : Reviewed.  No changes.  CURRENT MEDICATIONS: Reviewed per [facility]  REVIEW OF SYSTEMS:  Unobtainable due to dementia.   PHYSICAL EXAMINATION  VS:  T 98.1      P 66   RR 18   BP 120/70    POX %     WT (Lb) 148  GENERAL: no acute distress, normal body habitus NECK: supple, trachea midline, no neck masses, no thyroid tenderness, no thyromegaly RESPIRATORY: breathing is even & unlabored, BS CTAB CARDIAC: RRR, no murmur,no extra heart sounds EDEMA/VARICOSITIES:  +2 bilateral lower extremity edema  ARTERIAL:  pedal pulses nonpalpable  GI: abdomen soft, normal BS, no masses, no tenderness, no hepatomegaly, no splenomegaly PSYCHIATRIC: the patient is alert & oriented to person, affect & behavior appropriate  LABS/RADIOLOGY: 07/2012:  Liver profile normal.    05/2012:  Creatinine 1.18, otherwise BMP normal.   04/2012:  CBC normal.  ASSESSMENT/PLAN:  Diabetes mellitus.  Well controlled. Check hemoglobin A1c.   Hypertension.  Well controlled.    Alzheimer's dementia.  Advanced.    Hypokalemia.  Well repleted.  Continue supplementation.   Constipation.  Adequately controlled.   Osteoarthritis.  No active issues.    Check CBC and BMP.  CPT CODE: 40981

## 2012-11-04 ENCOUNTER — Non-Acute Institutional Stay (SKILLED_NURSING_FACILITY): Payer: Medicare Other | Admitting: Internal Medicine

## 2012-11-04 DIAGNOSIS — N189 Chronic kidney disease, unspecified: Secondary | ICD-10-CM

## 2012-11-23 NOTE — Progress Notes (Signed)
Patient ID: Heather [name], female   DOB: 03/03/1912, 79 y.o.   MRN: [id number]        PROGRESS NOTE  DATE: 11/04/2012  FACILITY:  [facility]  LEVEL OF CARE: SNF (31)  Acute Visit  CHIEF COMPLAINT:  Manage chronic kidney disease.    HISTORY OF PRESENT ILLNESS: I was requested by the staff to assess the patient regarding above problem(s):  CHRONIC KIDNEY DISEASE: The patient's chronic kidney disease remains stable.  Staff denies increasing lower extremity swelling or confusion. Patient is a poor historian due to dementia.  Last BUN and creatinine are:  On 11/02/2012, patient's creatinine is 1.13, BUN 22.  In 05/2012, BUN 28, creatinine 1.18.  PAST MEDICAL HISTORY : Reviewed.  No changes.  CURRENT MEDICATIONS: Reviewed per [facility]  PHYSICAL EXAMINATION  GENERAL: no acute distress, normal body habitus RESPIRATORY: breathing is even & unlabored, BS CTAB CARDIAC: RRR, no murmur,no extra heart sounds EDEMA/VARICOSITIES:  +2 bilateral lower extremity edema  ARTERIAL:  pedal pulses nonpalpable   ASSESSMENT/PLAN:  Chronic kidney disease.  Renal functions improved.    CPT CODE: 40981

## 2012-11-24 DIAGNOSIS — N189 Chronic kidney disease, unspecified: Secondary | ICD-10-CM | POA: Insufficient documentation

## 2012-12-09 ENCOUNTER — Non-Acute Institutional Stay (SKILLED_NURSING_FACILITY): Payer: Medicare Other | Admitting: Internal Medicine

## 2012-12-09 DIAGNOSIS — N390 Urinary tract infection, site not specified: Secondary | ICD-10-CM

## 2012-12-17 ENCOUNTER — Non-Acute Institutional Stay (SKILLED_NURSING_FACILITY): Payer: Medicare Other | Admitting: Internal Medicine

## 2012-12-17 DIAGNOSIS — E875 Hyperkalemia: Secondary | ICD-10-CM

## 2012-12-17 DIAGNOSIS — E1129 Type 2 diabetes mellitus with other diabetic kidney complication: Secondary | ICD-10-CM

## 2012-12-17 DIAGNOSIS — N189 Chronic kidney disease, unspecified: Secondary | ICD-10-CM

## 2012-12-17 DIAGNOSIS — I150 Renovascular hypertension: Secondary | ICD-10-CM

## 2012-12-17 NOTE — Progress Notes (Signed)
Patient ID: Heather VESSEY, female   DOB: 02-03-1912, 79 y.o.   MRN: [id number]        PROGRESS NOTE  DATE:  12/17/2012  FACILITY: [facility]   LEVEL OF CARE: SNF  Routine Visit  CHIEF COMPLAINT:  Manage chronic kidney disease, hypertension and diabetes mellitus.   HISTORY OF PRESENT ILLNESS:  REASSESSMENT OF ONGOING PROBLEM(S):  DM:pt's DM remains stable.  Staff deny polyuria, polydipsia, polyphagia, changes in vision or hypoglycemic episodes.  No complications noted from the medication presently being used.  Last hemoglobin A1c is:  5.8 in 04/2012, 6/14 hemoglobin A1c 5.6.  HTN: Pt 's HTN remains stable.  Staff deny CP, sob, DOE, pedal edema, headaches, dizziness or visual disturbances.  No complications from the medications currently being used.  Last BP : 120/70,  130/64, 120/68, 142/70, 140/70.  CHRONIC KIDNEY DISEASE: The patient's chronic kidney disease is unstable.  staff deny increasing lower extremity swelling or confusion. Last BUN and creatinine are: 26/1.85 on 12/15/2012, in 6/14 22/1.13.  PAST MEDICAL HISTORY : Reviewed.  No changes.  CURRENT MEDICATIONS: Reviewed per [facility]  REVIEW OF SYSTEMS:  Unobtainable due to dementia.   PHYSICAL EXAMINATION  VS:  T 98.3    P 72   RR 19   BP 140/70    POX %     WT (Lb) 150  GENERAL: no acute distress, normal body habitus NECK: supple, trachea midline, no neck masses, no thyroid tenderness, no thyromegaly RESPIRATORY: breathing is even & unlabored, BS CTAB CARDIAC: RRR, no murmur,no extra heart sounds EDEMA/VARICOSITIES:  +1 bilateral lower extremity edema  ARTERIAL:  pedal pulses nonpalpable  GI: abdomen soft, normal BS, no masses, no tenderness, no hepatomegaly, no splenomegaly PSYCHIATRIC: the patient is alert & oriented to person, affect & behavior appropriate  LABS/RADIOLOGY: 7/14 creatinine 1.85, potassium 5.3 otherwise BMP normal 07/2012:  Liver profile normal.    05/2012:  Creatinine 1.18, otherwise BMP normal.    04/2012:  CBC normal.  ASSESSMENT/PLAN:  Diabetes mellitus.  Well controlled.   Hypertension. BP borderline. Will monitor.   Chronic kidney disease- unstable problem. Renal functions worse. Decrease Lasix to 10 mg daily. Recheck on 12-13-12.  Alzheimer's dementia.  Advanced.    Hyperkalemia.  New problem. DC potassium. Recheck on 12/21/12.  Constipation.  Adequately controlled.   Osteoarthritis.  No active issues.   CPT CODE: 40981

## 2012-12-30 ENCOUNTER — Non-Acute Institutional Stay (SKILLED_NURSING_FACILITY): Payer: Medicare Other | Admitting: Internal Medicine

## 2012-12-30 DIAGNOSIS — K5900 Constipation, unspecified: Secondary | ICD-10-CM

## 2012-12-30 DIAGNOSIS — N189 Chronic kidney disease, unspecified: Secondary | ICD-10-CM

## 2013-01-01 DIAGNOSIS — N390 Urinary tract infection, site not specified: Secondary | ICD-10-CM | POA: Insufficient documentation

## 2013-01-01 NOTE — Progress Notes (Signed)
Patient ID: Heather [name], female   DOB: 02-08-1912, 79 y.o.   MRN: [id number]        PROGRESS NOTE  DATE: 12/09/2012  FACILITY:  [facility]  LEVEL OF CARE: SNF (31)  Acute Visit  CHIEF COMPLAINT:  Manage UTI.    HISTORY OF PRESENT ILLNESS: I was requested by the staff to assess the patient regarding above problem(s):  UTI: On 12/03/2012, patient's urinalysis showed 3+ WBC esterase, WBC 11-30, RBC 0-2.  Urine culture grows Proteus mirabilis significantly.  Patient is a poor historian due to dementia.     PAST MEDICAL HISTORY : Reviewed.  No changes.  CURRENT MEDICATIONS: Reviewed per [facility]  REVIEW OF SYSTEMS:  Unobtainable due to dementia.    PHYSICAL EXAMINATION  GENERAL: no acute distress, normal body habitus NECK: supple, trachea midline, no neck masses, no thyroid tenderness, no thyromegaly RESPIRATORY: breathing is even & unlabored, BS CTAB CARDIAC: RRR, no murmur,no extra heart sounds EDEMA/VARICOSITIES:  +2 bilateral lower extremity edema  ARTERIAL:  pedal pulses nonpalpable   GI: abdomen soft, normal BS, no masses, no tenderness, no hepatomegaly, no splenomegaly PSYCHIATRIC: the patient is alert & disoriented, affect & behavior appropriate  ASSESSMENT/PLAN:  UTI 599.0.  New problem.  Bactrim double-strength, 1 tablet b.i.d. for one week started, along with probiotics b.i.d. for 10 days.    CPT CODE: 40981

## 2013-01-07 ENCOUNTER — Non-Acute Institutional Stay (SKILLED_NURSING_FACILITY): Payer: Medicare Other | Admitting: Internal Medicine

## 2013-01-07 DIAGNOSIS — E1129 Type 2 diabetes mellitus with other diabetic kidney complication: Secondary | ICD-10-CM

## 2013-01-07 DIAGNOSIS — F0280 Dementia in other diseases classified elsewhere without behavioral disturbance: Secondary | ICD-10-CM

## 2013-01-07 DIAGNOSIS — I150 Renovascular hypertension: Secondary | ICD-10-CM

## 2013-01-07 DIAGNOSIS — G309 Alzheimer's disease, unspecified: Secondary | ICD-10-CM

## 2013-01-07 DIAGNOSIS — N189 Chronic kidney disease, unspecified: Secondary | ICD-10-CM

## 2013-01-08 NOTE — Progress Notes (Signed)
Patient ID: Heather [name], female   DOB: Dec 27, 1911, 79 y.o.   MRN: [id number]        PROGRESS NOTE  DATE:  01/07/2013  FACILITY: [facility]   LEVEL OF CARE: SNF  Routine Visit  CHIEF COMPLAINT:  Manage chronic kidney disease, hypertension and diabetes mellitus.   HISTORY OF PRESENT ILLNESS:  REASSESSMENT OF ONGOING PROBLEM(S):  DM:pt's DM remains stable.  Staff deny polyuria, polydipsia, polyphagia, changes in vision or hypoglycemic episodes.  No complications noted from the medication presently being used.  Last hemoglobin A1c is:  5.8 in 04/2012, 6/14 hemoglobin A1c 5.6.  HTN: Pt 's HTN remains stable.  Staff deny CP, sob, DOE, pedal edema, headaches, dizziness or visual disturbances.  No complications from the medications currently being used.  Last BP : 120/70,  130/64, 120/68, 142/70, 140/70, 138/74.  CHRONIC KIDNEY DISEASE: The patient's chronic kidney disease is unstable.  staff deny increasing lower extremity swelling or confusion. Last BUN and creatinine are: 26/1.85 on 12/15/2012, in 6/14 22/1.13.  PAST MEDICAL HISTORY : Reviewed.  No changes.  CURRENT MEDICATIONS: Reviewed per [facility]  REVIEW OF SYSTEMS:  Unobtainable due to dementia.   PHYSICAL EXAMINATION  VS:  T 96.7    P 74   RR 18   BP 138/74    POX %     WT (Lb) 150  GENERAL: no acute distress, normal body habitus RESPIRATORY: breathing is even & unlabored, BS CTAB CARDIAC: RRR, no murmur,no extra heart sounds EDEMA/VARICOSITIES:  +1 bilateral lower extremity edema  ARTERIAL:  pedal pulses nonpalpable  GI: abdomen soft, normal BS, no masses, no tenderness, no hepatomegaly, no splenomegaly PSYCHIATRIC: the patient is alert & disoriented, affect & behavior appropriate  LABS/RADIOLOGY: 7/14 creatinine 1.85, potassium 5.3 otherwise BMP normal, repeat BMP showed creatinine 1.66 otherwise BMP normal 07/2012:  Liver profile normal.    05/2012:  Creatinine 1.18, otherwise BMP normal.   04/2012:  CBC  normal.  ASSESSMENT/PLAN:  Diabetes mellitus.  Well controlled.   Hypertension. Stable   Chronic kidney disease- renal functions improved after decreasing Lasix.  Alzheimer's dementia.  Advanced.    Constipation.  MiraLax was started  Osteoarthritis.  No active issues.   Check CBC  CPT CODE: 40981

## 2013-01-20 DIAGNOSIS — K5900 Constipation, unspecified: Secondary | ICD-10-CM | POA: Insufficient documentation

## 2013-01-20 NOTE — Progress Notes (Signed)
Patient ID: Heather [name], female   DOB: [birth year], 79 y.o.   MRN: [id number]        PROGRESS NOTE  DATE: 12/30/2012  FACILITY:  [facility]  LEVEL OF CARE: SNF (31)  Acute Visit  CHIEF COMPLAINT:  Manage constipation and chronic kidney disease.    HISTORY OF PRESENT ILLNESS: I was requested by the staff to assess the patient regarding above problem(s):  CONSTIPATION: The constipation is unstable. No complications from the medications presently being used. Staff denies abdominal pain, nausea or vomiting.  Staff report constipation despite being on Senokot twice a day.  Patient is a poor historian due to dementia.    CHRONIC KIDNEY DISEASE: The patient's chronic kidney disease remains stable.  Staff denies increasing lower extremity swelling or confusion. Last BUN and creatinine are:  On 12/21/2012:  BUN 32, creatinine 1.66.  On 12/15/2012:  Creatinine 1.85.    PAST MEDICAL HISTORY : Reviewed.  No changes.  CURRENT MEDICATIONS: Reviewed per [facility]  REVIEW OF SYSTEMS:  Unobtainable due to dementia.    PHYSICAL EXAMINATION  GENERAL: no acute distress, normal body habitus NECK: supple, trachea midline, no neck masses, no thyroid tenderness, no thyromegaly RESPIRATORY: breathing is even & unlabored, BS CTAB CARDIAC: RRR, no murmur,no extra heart sounds, no edema GI: abdomen soft, normal BS, no masses, no tenderness, no hepatomegaly, no splenomegaly PSYCHIATRIC: the patient is minimally alert, disoriented, decreased affect and mood    ASSESSMENT/PLAN:  Constipation.  Uncontrolled problem.  Start MiraLAX 17 g q.d.    Chronic kidney disease.  Renal functions improved.    CPT CODE: 40981

## 2013-02-01 ENCOUNTER — Other Ambulatory Visit: Payer: Self-pay | Admitting: *Deleted

## 2013-02-01 MED ORDER — TRAMADOL HCL 50 MG PO TABS: ORAL_TABLET | ORAL | Status: DC

## 2013-02-02 ENCOUNTER — Encounter: Payer: Self-pay | Admitting: Family

## 2013-02-02 ENCOUNTER — Non-Acute Institutional Stay (SKILLED_NURSING_FACILITY): Payer: Medicare Other | Admitting: Family

## 2013-02-02 DIAGNOSIS — N189 Chronic kidney disease, unspecified: Secondary | ICD-10-CM

## 2013-02-02 DIAGNOSIS — E1129 Type 2 diabetes mellitus with other diabetic kidney complication: Secondary | ICD-10-CM

## 2013-02-02 DIAGNOSIS — I150 Renovascular hypertension: Secondary | ICD-10-CM

## 2013-02-02 DIAGNOSIS — K5900 Constipation, unspecified: Secondary | ICD-10-CM

## 2013-02-02 DIAGNOSIS — F0280 Dementia in other diseases classified elsewhere without behavioral disturbance: Secondary | ICD-10-CM

## 2013-02-02 MED ORDER — CARRINGTON MOISTURE BARRIER EX CREA: TOPICAL_CREAM | CUTANEOUS | Status: DC | PRN

## 2013-02-02 NOTE — Progress Notes (Signed)
Patient ID: Heather [name], female   DOB: December 22, 1911, 79 y.o.   MRN: [id number] DATE: 02/02/13  FACILITY: [facility]   LEVEL OF CARE: SNF  Routine Visit  CHIEF COMPLAINT:  Manage chronic kidney disease, hypertension and diabetes mellitus.   HISTORY OF PRESENT ILLNESS:  REASSESSMENT OF ONGOING PROBLEM(S): Historians: Pt and nursing staff  Reliabiltiy for pt 4/5 Reliabiltiy for nursing staff 5/5 DM: Patient and nursing staff denies polyuria, polydipsia, polyphagia, changes in vision or hypoglycemic episodes.  No complications noted from the medication presently being used  HTN: Patient and nursing staff denies CP, sob, DOE, headaches, dizziness or visual disturbances.  No complications from the medications currently being used  CHRONIC KIDNEY DISEASE: Pt and nursing  staff denies increasing lower extremity swelling or confusion.   Past Medical History  Diagnosis Date  . Hypertension   . Osteoarthritis   . Renal insufficiency   . Diabetes mellitus without complication        Medication List       This list is accurate as of: 02/02/13  3:42 PM.  Always use your most recent med list.               furosemide 20 MG tablet  Commonly known as:  LASIX  Take 10 mg by mouth.     losartan 50 MG tablet  Commonly known as:  COZAAR  Take 75 mg by mouth daily.     polyethylene glycol packet  Commonly known as:  MIRALAX / GLYCOLAX  Take 17 g by mouth daily.     polyvinyl alcohol 1.4 % ophthalmic solution  Commonly known as:  LIQUIFILM TEARS  2 drops as needed (4 times a day for dry eyes wait 3-5 minutes between 2 eye meds).     senna 8.6 MG tablet  Commonly known as:  SENOKOT  Take 2 tablets by mouth 2 (two) times daily.     traMADol 50 MG tablet  Commonly known as:  ULTRAM  Take two tablets by mouth twice daily         Review of Systems  Constitutional: Negative.   HENT: Negative.   Eyes: Negative.   Respiratory: Negative.   Cardiovascular: Positive for leg  swelling. Negative for chest pain, palpitations and orthopnea.  Gastrointestinal: Negative.   Genitourinary: Negative.   Musculoskeletal: Negative.   Skin: Positive for itching.       Dry and itchy BLE  Neurological: Negative.   Endo/Heme/Allergies: Negative.   Psychiatric/Behavioral: Negative.       PHYSICAL EXAMINATION  Filed Vitals:   02/02/13 1509  BP: 138/70  Pulse: 71  Temp: 98.5 F (36.9 C)  Resp: 20    Physical Exam  Constitutional: No distress.  HENT:  Head: Normocephalic.  Mouth/Throat: Oropharynx is clear and moist.  Eyes: Pupils are equal, round, and reactive to light.  Neck: No thyromegaly present.  Cardiovascular: Normal rate and regular rhythm.   Pulmonary/Chest: Effort normal and breath sounds normal. No respiratory distress.  Abdominal: Soft. Bowel sounds are normal.  Musculoskeletal:  DME-Wheelchair, Generalized Weakness  Neurological: She is alert.  Oriented to self;Arousable  Skin: Skin is warm and dry.  BLE and feet ashen dry however intact    LABS/RADIOLOGY: 7/14 creatinine 1.85, potassium 5.3 otherwise BMP normal, repeat BMP showed creatinine 1.66 otherwise BMP normal 07/2012:  Liver profile normal.    05/2012:  Creatinine 1.18, otherwise BMP normal.   04/2012:  CBC normal.  ASSESSMENT/PLAN:  Diabetes mellitus.  Well controlled.  Hypertension. Stable   Chronic kidney disease- renal functions improved after decreasing Lasix.  Alzheimer's dementia.  Advanced.    Constipation.  MiraLax was started  Osteoarthritis.  No active issues.   Check CBC  Spent 50 minutes with patient

## 2013-03-11 ENCOUNTER — Non-Acute Institutional Stay (SKILLED_NURSING_FACILITY): Payer: Medicare Other | Admitting: Internal Medicine

## 2013-03-11 DIAGNOSIS — I150 Renovascular hypertension: Secondary | ICD-10-CM

## 2013-03-11 DIAGNOSIS — E1129 Type 2 diabetes mellitus with other diabetic kidney complication: Secondary | ICD-10-CM

## 2013-03-11 DIAGNOSIS — N189 Chronic kidney disease, unspecified: Secondary | ICD-10-CM

## 2013-03-11 DIAGNOSIS — F0280 Dementia in other diseases classified elsewhere without behavioral disturbance: Secondary | ICD-10-CM

## 2013-03-13 NOTE — Progress Notes (Signed)
Patient ID: Heather [name], female   DOB: [birth year], 79 y.o.   MRN: [id number]        PROGRESS NOTE  DATE:  03/11/2013  FACILITY: [facility]   LEVEL OF CARE: SNF  Routine Visit  CHIEF COMPLAINT:  Manage chronic kidney disease, hypertension and diabetes mellitus.   HISTORY OF PRESENT ILLNESS:  REASSESSMENT OF ONGOING PROBLEM(S):  DM:pt's DM remains stable.  Staff deny polyuria, polydipsia, polyphagia, changes in vision or hypoglycemic episodes.  No complications noted from the medication presently being used.  Last hemoglobin A1c is:  5.8 in 04/2012, 6/14 hemoglobin A1c 5.6.  HTN: Pt 's HTN remains stable.  Staff deny CP, sob, DOE, pedal edema, headaches, dizziness or visual disturbances.  No complications from the medications currently being used.  Last BP : 120/70,  130/64, 120/68, 142/70, 140/70, 138/74, 122/81.  CHRONIC KIDNEY DISEASE: The patient's chronic kidney disease is unstable.  staff deny increasing lower extremity swelling or confusion. Last BUN and creatinine are: 26/1.85 on 12/15/2012, in 6/14 22/1.13.  On 12-21-12 bun 32, cr 1.66.  PAST MEDICAL HISTORY : Reviewed.  No changes.  CURRENT MEDICATIONS: Reviewed per [facility]  REVIEW OF SYSTEMS:  Unobtainable due to dementia.   PHYSICAL EXAMINATION  VS:  T 98.7    P 65   RR 19   BP 122/81    POX %     WT (Lb) 152  GENERAL: no acute distress, normal body habitus RESPIRATORY: breathing is even & unlabored, BS CTAB CARDIAC: RRR, no murmur,no extra heart sounds EDEMA/VARICOSITIES:  +1 bilateral lower extremity edema  ARTERIAL:  pedal pulses nonpalpable  GI: abdomen soft, normal BS, no masses, no tenderness, no hepatomegaly, no splenomegaly PSYCHIATRIC: the patient is alert & disoriented, affect & behavior appropriate  LABS/RADIOLOGY:  8-14 cbc nl 7/14 creatinine 1.85, potassium 5.3 otherwise BMP normal, repeat BMP showed creatinine 1.66 otherwise BMP normal 07/2012:  Liver profile normal.    05/2012:  Creatinine 1.18,  otherwise BMP normal.   04/2012:  CBC normal.  ASSESSMENT/PLAN:  Diabetes mellitus.  Well controlled.   Hypertension. Stable   Chronic kidney disease- renal functions improved.  Alzheimer's dementia.  Advanced.    Constipation.  On MiraLax.  Osteoarthritis.  No active issues.   Check liver profile.  CPT CODE: 40981

## 2013-04-27 ENCOUNTER — Non-Acute Institutional Stay (SKILLED_NURSING_FACILITY): Payer: PRIVATE HEALTH INSURANCE | Admitting: Internal Medicine

## 2013-04-27 DIAGNOSIS — I10 Essential (primary) hypertension: Secondary | ICD-10-CM

## 2013-04-27 DIAGNOSIS — E1129 Type 2 diabetes mellitus with other diabetic kidney complication: Secondary | ICD-10-CM

## 2013-04-27 DIAGNOSIS — N189 Chronic kidney disease, unspecified: Secondary | ICD-10-CM

## 2013-04-27 DIAGNOSIS — F0280 Dementia in other diseases classified elsewhere without behavioral disturbance: Secondary | ICD-10-CM

## 2013-04-30 ENCOUNTER — Encounter: Payer: Self-pay | Admitting: Internal Medicine

## 2013-04-30 NOTE — Progress Notes (Signed)
Patient ID: Heather [name], female   DOB: April 28, 1912, 79 y.o.   MRN: [id number]        PROGRESS NOTE  DATE:  04/27/2013  FACILITY: [facility]   LEVEL OF CARE: SNF  Routine Visit  CHIEF COMPLAINT:  Manage chronic kidney disease, hypertension and diabetes mellitus.   HISTORY OF PRESENT ILLNESS:  REASSESSMENT OF ONGOING PROBLEM(S):  DM:pt's DM remains stable.  Staff deny polyuria, polydipsia, polyphagia, changes in vision or hypoglycemic episodes.  No complications noted from the medication presently being used.  Last hemoglobin A1c is:  5.8 in 04/2012, 6/14 hemoglobin A1c 5.6.  HTN: Pt 's HTN remains stable.  Staff deny CP, sob, DOE, pedal edema, headaches, dizziness or visual disturbances.  No complications from the medications currently being used.  Last BP : 120/70,  130/64, 120/68, 142/70, 140/70, 138/74, 122/81, 108/66.  CHRONIC KIDNEY DISEASE: The patient's chronic kidney disease is unstable.  staff deny increasing lower extremity swelling or confusion. Last BUN and creatinine are: 26/1.85 on 12/15/2012, in 6/14 22/1.13.  On 12-21-12 bun 32, cr 1.66.  PAST MEDICAL HISTORY : Reviewed.  No changes.  CURRENT MEDICATIONS: Reviewed per [facility]  REVIEW OF SYSTEMS:  Unobtainable due to dementia.   PHYSICAL EXAMINATION  VS:  T 98.1    P 108   RR 16   BP 108/66    POX %     WT (Lb) 152  GENERAL: no acute distress, normal body habitus EYES: Normal sclerae, normal conjunctivae, no discharge LYMPHATICS: No cervical lymphadenopathy, no supraclavicular lymphadenopathy RESPIRATORY: breathing is even & unlabored, BS CTAB CARDIAC: RRR, no murmur,no extra heart sounds EDEMA/VARICOSITIES:  +1 bilateral lower extremity edema  ARTERIAL:  pedal pulses nonpalpable  GI: abdomen soft, normal BS, no masses, no tenderness, no hepatomegaly, no splenomegaly PSYCHIATRIC: the patient is alert & disoriented, affect & behavior appropriate  LABS/RADIOLOGY:  10- 14 total protein 5.6 otherwise liver profile  normal  8-14 cbc nl 7/14 creatinine 1.85, potassium 5.3 otherwise BMP normal, repeat BMP showed creatinine 1.66 otherwise BMP normal 07/2012:  Liver profile normal.    05/2012:  Creatinine 1.18, otherwise BMP normal.   04/2012:  CBC normal.  ASSESSMENT/PLAN:  Diabetes mellitus.  Well controlled.   Hypertension. Stable   Chronic kidney disease- renal functions improved.  Alzheimer's dementia.  Advanced.    Constipation.  On MiraLax.  Osteoarthritis.  No active issues.   CPT CODE: 69629

## 2013-06-15 ENCOUNTER — Non-Acute Institutional Stay (SKILLED_NURSING_FACILITY): Payer: PRIVATE HEALTH INSURANCE | Admitting: Internal Medicine

## 2013-06-15 DIAGNOSIS — G309 Alzheimer's disease, unspecified: Secondary | ICD-10-CM

## 2013-06-15 DIAGNOSIS — N189 Chronic kidney disease, unspecified: Secondary | ICD-10-CM

## 2013-06-15 DIAGNOSIS — I150 Renovascular hypertension: Secondary | ICD-10-CM

## 2013-06-15 DIAGNOSIS — F0280 Dementia in other diseases classified elsewhere without behavioral disturbance: Secondary | ICD-10-CM

## 2013-06-15 DIAGNOSIS — E1129 Type 2 diabetes mellitus with other diabetic kidney complication: Secondary | ICD-10-CM

## 2013-06-15 NOTE — Progress Notes (Signed)
Patient ID: Heather [name], female   DOB: [birth year], 80 y.o.   MRN: [id number]         PROGRESS NOTE  DATE:  06/15/2013  [facility]   LEVEL OF CARE: SNF  Routine Visit  CHIEF COMPLAINT:  Manage chronic kidney disease, hypertension and diabetes mellitus.   HISTORY OF PRESENT ILLNESS:  REASSESSMENT OF ONGOING PROBLEM(S):  DM:pt's DM remains stable.  Staff deny polyuria, polydipsia, polyphagia, changes in vision or hypoglycemic episodes.  No complications noted from the medication presently being used.  Last hemoglobin A1c is:  5.8 in 04/2012, 6/14 hemoglobin A1c 5.6, in 12-14 hemoglobin A1c 5.8.  HTN: Pt 's HTN remains stable.  Staff deny CP, sob, DOE, pedal edema, headaches, dizziness or visual disturbances.  No complications from the medications currently being used.  Last BP : 120/70,  130/64, 120/68, 142/70, 140/70, 138/74, 122/81, 108/66, 116/60.  CHRONIC KIDNEY DISEASE: The patient's chronic kidney disease is unstable.  staff deny increasing lower extremity swelling or confusion. Last BUN and creatinine are: 26/1.85 on 12/15/2012, in 6/14 22/1.13.  On 12-21-12 bun 32, cr 1.66.  PAST MEDICAL HISTORY : Reviewed.  No changes.  [facility]  REVIEW OF SYSTEMS:  Unobtainable due to dementia.   PHYSICAL EXAMINATION  VS:  T 98.6    P 78   RR 18  BP 116/60    POX %     WT (Lb) 156.5  GENERAL: no acute distress, normal body habitus EYES: Normal sclerae, normal conjunctivae, no discharge LYMPHATICS: No cervical lymphadenopathy, no supraclavicular lymphadenopathy RESPIRATORY: breathing is even & unlabored, BS CTAB CARDIAC: RRR, no murmur,no extra heart sounds EDEMA/VARICOSITIES:  +1 bilateral lower extremity edema  ARTERIAL:  pedal pulses nonpalpable  GI: abdomen soft, normal BS, no masses, no tenderness, no hepatomegaly, no splenomegaly PSYCHIATRIC: the patient is alert & disoriented, affect & behavior appropriate  LABS/RADIOLOGY:  10- 14  total protein 5.6 otherwise liver profile normal  8-14 cbc nl 7/14 creatinine 1.85, potassium 5.3 otherwise BMP normal, repeat BMP showed creatinine 1.66 otherwise BMP normal 07/2012:  Liver profile normal.    05/2012:  Creatinine 1.18, otherwise BMP normal.   04/2012:  CBC normal.  ASSESSMENT/PLAN:  Diabetes mellitus.  Well controlled.   Hypertension. Stable   Chronic kidney disease- renal functions improved. Recheck  Alzheimer's dementia.  Advanced.    Constipation.  On MiraLax.  Osteoarthritis.  No active issues.   Check BMP  CPT CODE: [id number]

## 2013-07-13 ENCOUNTER — Encounter: Payer: Self-pay | Admitting: *Deleted

## 2013-08-02 ENCOUNTER — Other Ambulatory Visit: Payer: Self-pay | Admitting: *Deleted

## 2013-08-02 MED ORDER — TRAMADOL HCL 50 MG PO TABS: ORAL_TABLET | ORAL | Status: DC

## 2013-08-02 NOTE — Telephone Encounter (Signed)
[facility] 

## 2013-09-22 ENCOUNTER — Non-Acute Institutional Stay (SKILLED_NURSING_FACILITY): Payer: PRIVATE HEALTH INSURANCE | Admitting: Internal Medicine

## 2013-09-22 DIAGNOSIS — F0280 Dementia in other diseases classified elsewhere without behavioral disturbance: Secondary | ICD-10-CM

## 2013-09-22 DIAGNOSIS — E1129 Type 2 diabetes mellitus with other diabetic kidney complication: Secondary | ICD-10-CM

## 2013-09-22 DIAGNOSIS — I150 Renovascular hypertension: Secondary | ICD-10-CM

## 2013-09-22 DIAGNOSIS — G309 Alzheimer's disease, unspecified: Secondary | ICD-10-CM

## 2013-09-22 DIAGNOSIS — N189 Chronic kidney disease, unspecified: Secondary | ICD-10-CM

## 2013-09-24 NOTE — Progress Notes (Signed)
Patient ID: Heather NeighborsGussie F [name], female   DOB: 01-14-1912, [age] y.o.   MRN: [id number]          PROGRESS NOTE  DATE:  09/22/2013  [facility]   LEVEL OF CARE: SNF  Routine Visit  CHIEF COMPLAINT:  Manage chronic kidney disease, hypertension and diabetes mellitus.   HISTORY OF PRESENT ILLNESS:  REASSESSMENT OF ONGOING PROBLEM(S):  DM:pt's DM remains stable.  Staff deny polyuria, polydipsia, polyphagia, changes in vision or hypoglycemic episodes.  No complications noted from the medication presently being used.  Last hemoglobin A1c is:  5.8 in 04/2012, 6/14 hemoglobin A1c 5.6, in 12-14 hemoglobin A1c 5.8, in 3-15 hemoglobin A1c 6.2.  HTN: Pt 's HTN remains stable.  Staff deny CP, sob, DOE, pedal edema, headaches, dizziness or visual disturbances.  No complications from the medications currently being used.  Last BP : 120/70,  130/64, 120/68, 142/70, 140/70, 138/74, 122/81, 108/66, 116/60, 138/82.  CHRONIC KIDNEY DISEASE: The patient's chronic kidney disease is unstable.  staff deny increasing lower extremity swelling or confusion. Last BUN and creatinine are: 26/1.85 on 12/15/2012, in 6/14 22/1.13.  On 12-21-12 bun 32, cr 1.66, in 3-15 BUN 18, creatinine 1.08.  PAST MEDICAL HISTORY : Reviewed.  No changes.  CURRENT MED[facility]: [facility]  REVIEW OF SYSTEMS:  Unobtainable due to dementia.   PHYSICAL EXAMINATION  VS:  See vital signs section  GENERAL: no acute distress, normal body habitus EYES: Normal sclerae, normal conjunctivae, no discharge LYMPHATICS: No cervical lymphadenopathy, no supraclavicular lymphadenopathy RESPIRATORY: breathing is even & unlabored, BS CTAB CARDIAC: RRR, no murmur,no extra heart sounds EDEMA/VARICOSITIES:  +1 bilateral lower extremity edema  ARTERIAL:  pedal pulses nonpalpable  GI: abdomen soft, normal BS, no masses, no tenderness, no hepatomegaly, no splenomegaly PSYCHIATRIC: the patient is alert & disoriented, affect & behavior  appropriate  LABS/RADIOLOGY: 3-15 CBC normal, glucose 102, creatinine 1.02 otherwise CMP normal, TSH 0.477 10- 14 total protein 5.6 otherwise liver profile normal  8-14 cbc nl 7/14 creatinine 1.85, potassium 5.3 otherwise BMP normal, repeat BMP showed creatinine 1.66 otherwise BMP normal 07/2012:  Liver profile normal.    05/2012:  Creatinine 1.18, otherwise BMP normal.   04/2012:  CBC normal.  ASSESSMENT/PLAN:  Diabetes mellitus.  Well controlled.   Hypertension. Stable   Chronic kidney disease- stable.  Alzheimer's dementia.  Advanced.    Constipation.  On MiraLax.  Osteoarthritis.  No active issues.   CPT CODE: [id number]  Newton PiggGayani Y. Kerry Doryasanayaka, MD [facility] [phone]

## 2013-10-11 ENCOUNTER — Non-Acute Institutional Stay (SKILLED_NURSING_FACILITY): Payer: PRIVATE HEALTH INSURANCE | Admitting: Internal Medicine

## 2013-10-11 DIAGNOSIS — N189 Chronic kidney disease, unspecified: Secondary | ICD-10-CM

## 2013-10-11 DIAGNOSIS — E1129 Type 2 diabetes mellitus with other diabetic kidney complication: Secondary | ICD-10-CM

## 2013-10-11 DIAGNOSIS — F0280 Dementia in other diseases classified elsewhere without behavioral disturbance: Secondary | ICD-10-CM

## 2013-10-11 DIAGNOSIS — I150 Renovascular hypertension: Secondary | ICD-10-CM

## 2013-10-11 DIAGNOSIS — G309 Alzheimer's disease, unspecified: Secondary | ICD-10-CM

## 2013-10-11 NOTE — Progress Notes (Signed)
Patient ID: Heather [name], female   DOB: [birth year], [age] y.o.   MRN: [id number]           PROGRESS NOTE  DATE:  10/11/2013  [facility]   LEVEL OF CARE: SNF  Routine Visit  CHIEF COMPLAINT:  Manage chronic kidney disease, hypertension and diabetes mellitus.   HISTORY OF PRESENT ILLNESS:  REASSESSMENT OF ONGOING PROBLEM(S):  DM:pt's DM remains stable.  Staff deny polyuria, polydipsia, polyphagia, changes in vision or hypoglycemic episodes.  No complications noted from the medication presently being used.  Last hemoglobin A1c is:  5.8 in 04/2012, 6/14 hemoglobin A1c 5.6, in 12-14 hemoglobin A1c 5.8, in 3-15 hemoglobin A1c 6.2.  HTN: Pt 's HTN remains stable.  Staff deny CP, sob, DOE, pedal edema, headaches, dizziness or visual disturbances.  No complications from the medications currently being used.  Last BP : 120/70,  130/64, 120/68, 142/70, 140/70, 138/74, 122/81, 108/66, 116/60, 138/82, 142/92.  CHRONIC KIDNEY DISEASE: The patient's chronic kidney disease is unstable.  staff deny increasing lower extremity swelling or confusion. Last BUN and creatinine are: 26/1.85 on 12/15/2012, in 6/14 22/1.13.  On 12-21-12 bun 32, cr 1.66, in 3-15 BUN 18, creatinine 1.08.  PAST MEDICAL HISTORY : Reviewed.  No changes.  [facility]  REVIEW OF SYSTEMS:  Unobtainable due to dementia.   PHYSICAL EXAMINATION  VS:  See vital signs section  GENERAL: no acute distress, normal body habitus EYES: Normal sclerae, normal conjunctivae, no discharge LYMPHATICS: No cervical lymphadenopathy, no supraclavicular lymphadenopathy RESPIRATORY: breathing is even & unlabored, BS CTAB CARDIAC: RRR, no murmur,no extra heart sounds EDEMA/VARICOSITIES:  +2 bilateral lower extremity edema  ARTERIAL:  pedal pulses nonpalpable  GI: abdomen soft, normal BS, no masses, no tenderness, no hepatomegaly, no splenomegaly PSYCHIATRIC: the patient is alert & disoriented, affect & behavior  appropriate  LABS/RADIOLOGY: 3-15 CBC normal, glucose 102, creatinine 1.02 otherwise CMP normal, TSH 0.477 10- 14 total protein 5.6 otherwise liver profile normal  8-14 cbc nl 7/14 creatinine 1.85, potassium 5.3 otherwise BMP normal, repeat BMP showed creatinine 1.66 otherwise BMP normal 07/2012:  Liver profile normal.    05/2012:  Creatinine 1.18, otherwise BMP normal.   04/2012:  CBC normal.  ASSESSMENT/PLAN:  Diabetes mellitus.  Well controlled.   Hypertension. Blood pressure is elevated. We review a log.  Chronic kidney disease- stable.  Alzheimer's dementia.  Advanced.    Constipation.  On MiraLax.  Osteoarthritis.  No active issues.   CPT CODE: 1191499309  Newton PiggGayani Y. Kerry Doryasanayaka, MD [facility] [phone]

## 2013-11-03 ENCOUNTER — Non-Acute Institutional Stay (SKILLED_NURSING_FACILITY): Payer: PRIVATE HEALTH INSURANCE | Admitting: Internal Medicine

## 2013-11-03 DIAGNOSIS — F0280 Dementia in other diseases classified elsewhere without behavioral disturbance: Secondary | ICD-10-CM

## 2013-11-03 DIAGNOSIS — N189 Chronic kidney disease, unspecified: Secondary | ICD-10-CM

## 2013-11-03 DIAGNOSIS — E1129 Type 2 diabetes mellitus with other diabetic kidney complication: Secondary | ICD-10-CM

## 2013-11-03 DIAGNOSIS — I150 Renovascular hypertension: Secondary | ICD-10-CM

## 2013-11-03 DIAGNOSIS — G309 Alzheimer's disease, unspecified: Secondary | ICD-10-CM

## 2013-11-03 NOTE — Progress Notes (Signed)
Patient ID: Heather [name], female   DOB: 1912/04/21, 80 y.o.   MRN: [id number]         PROGRESS NOTE  DATE:  11/03/2013  [facility]   LEVEL OF CARE: SNF  Routine Visit  CHIEF COMPLAINT:  Manage chronic kidney disease, hypertension and diabetes mellitus.   HISTORY OF PRESENT ILLNESS:  REASSESSMENT OF ONGOING PROBLEM(S):  DM:pt's DM remains stable.  Staff deny polyuria, polydipsia, polyphagia, changes in vision or hypoglycemic episodes.  No complications noted from the medication presently being used.  Last hemoglobin A1c is:  5.8 in 04/2012, 6/14 hemoglobin A1c 5.6, in 12-14 hemoglobin A1c 5.8, in 3-15 hemoglobin A1c 6.2.  HTN: Pt 's HTN remains stable.  Staff deny CP, sob, DOE, pedal edema, headaches, dizziness or visual disturbances.  No complications from the medications currently being used.  Last BP : 120/70,  130/64, 120/68, 142/70, 140/70, 138/74, 122/81, 108/66, 116/60, 138/82, 142/92, 132/74.  CHRONIC KIDNEY DISEASE: The patient's chronic kidney disease is unstable.  staff deny increasing lower extremity swelling or confusion. Last BUN and creatinine are: 26/1.85 on 12/15/2012, in 6/14 22/1.13.  On 12-21-12 bun 32, cr 1.66, in 3-15 BUN 18, creatinine 1.08.  PAST MEDICAL HISTORY : Reviewed.  No changes.  CURRENT MEDICATIONS: Reviewed per [facility]  REVIEW OF SYSTEMS:  Unobtainable due to dementia.   PHYSICAL EXAMINATION  VS:  See vital signs section  GENERAL: no acute distress, normal body habitus EYES: Normal sclerae, normal conjunctivae, no discharge LYMPHATICS: No cervical lymphadenopathy, no supraclavicular lymphadenopathy RESPIRATORY: breathing is even & unlabored, BS CTAB CARDIAC: RRR, no murmur,no extra heart sounds EDEMA/VARICOSITIES:  +2 bilateral lower extremity edema  ARTERIAL:  pedal pulses nonpalpable  GI: abdomen soft, normal BS, no masses, no tenderness, no hepatomegaly, no splenomegaly PSYCHIATRIC: the patient is alert & disoriented, affect &  behavior appropriate  LABS/RADIOLOGY: 3-15 CBC normal, glucose 102, creatinine 1.02 otherwise CMP normal, TSH 0.477 10- 14 total protein 5.6 otherwise liver profile normal  8-14 cbc nl 7/14 creatinine 1.85, potassium 5.3 otherwise BMP normal, repeat BMP showed creatinine 1.66 otherwise BMP normal 07/2012:  Liver profile normal.    05/2012:  Creatinine 1.18, otherwise BMP normal.   04/2012:  CBC normal.  ASSESSMENT/PLAN:  Diabetes mellitus.  Well controlled.   Hypertension. Well controlled  Chronic kidney disease- stable.  Alzheimer's dementia.  Advanced.    Constipation.  On MiraLax.  Osteoarthritis.  No active issues.   CPT CODE: 15830  Newton Pigg. Kerry Dory, MD [facility] [phone]

## 2013-11-05 ENCOUNTER — Other Ambulatory Visit: Payer: Self-pay

## 2013-11-05 MED ORDER — TRAMADOL HCL 50 MG PO TABS: ORAL_TABLET | ORAL | Status: DC

## 2013-11-05 NOTE — Telephone Encounter (Signed)
Rx faxed to [facility] @ [phone], phone number [phone]  

## 2013-12-02 ENCOUNTER — Non-Acute Institutional Stay (SKILLED_NURSING_FACILITY): Payer: PRIVATE HEALTH INSURANCE | Admitting: Internal Medicine

## 2013-12-02 DIAGNOSIS — G309 Alzheimer's disease, unspecified: Secondary | ICD-10-CM

## 2013-12-02 DIAGNOSIS — E1165 Type 2 diabetes mellitus with hyperglycemia: Principal | ICD-10-CM

## 2013-12-02 DIAGNOSIS — I150 Renovascular hypertension: Secondary | ICD-10-CM

## 2013-12-02 DIAGNOSIS — E1129 Type 2 diabetes mellitus with other diabetic kidney complication: Secondary | ICD-10-CM

## 2013-12-02 DIAGNOSIS — N189 Chronic kidney disease, unspecified: Secondary | ICD-10-CM

## 2013-12-02 DIAGNOSIS — F0280 Dementia in other diseases classified elsewhere without behavioral disturbance: Secondary | ICD-10-CM

## 2013-12-02 NOTE — Progress Notes (Signed)
Patient ID: Heather [name], female   DOB: 03-11-1912, 80 y.o.   MRN: [id number]         PROGRESS NOTE  DATE:  12/02/2013  [facility]   LEVEL OF CARE: SNF  Routine Visit  CHIEF COMPLAINT:  Manage chronic kidney disease, hypertension and diabetes mellitus.   HISTORY OF PRESENT ILLNESS:  REASSESSMENT OF ONGOING PROBLEM(S):  DM:pt's DM remains stable.  Staff deny polyuria, polydipsia, polyphagia, changes in vision or hypoglycemic episodes.  No complications noted from the medication presently being used.  Last hemoglobin A1c is:  5.8 in 04/2012, 6/14 hemoglobin A1c 5.6, in 12-14 hemoglobin A1c 5.8, in 3-15 hemoglobin A1c 6.2, in 6-15 hemoglobin A1c 6.1.  HTN: Pt 's HTN remains stable.  Staff deny CP, sob, DOE, pedal edema, headaches, dizziness or visual disturbances.  No complications from the medications currently being used.  Last BP : 120/70,  130/64, 120/68, 142/70, 140/70, 138/74, 122/81, 108/66, 116/60, 138/82, 142/92, 132/74, 140/84.  CHRONIC KIDNEY DISEASE: The patient's chronic kidney disease is unstable.  staff deny increasing lower extremity swelling or confusion. Last BUN and creatinine are: 26/1.85 on 12/15/2012, in 6/14 22/1.13.  On 12-21-12 bun 32, cr 1.66, in 3-15 BUN 18, creatinine 1.08.  PAST MEDICAL HISTORY : Reviewed.  No changes.  [facility] MED[facility]S: [facility]  REVIEW OF SYSTEMS:  Unobtainable due to dementia.   PHYSICAL EXAMINATION  VS:  See vital signs section  GENERAL: no acute distress, normal body habitus EYES: Normal sclerae, normal conjunctivae, no discharge LYMPHATICS: No cervical lymphadenopathy, no supraclavicular lymphadenopathy RESPIRATORY: breathing is even & unlabored, BS CTAB CARDIAC: RRR, no murmur,no extra heart sounds EDEMA/VARICOSITIES:  +2 bilateral lower extremity edema  ARTERIAL:  pedal pulses nonpalpable  GI: abdomen soft, normal BS, no masses, no tenderness, no hepatomegaly, no splenomegaly PSYCHIATRIC: the patient is  alert & disoriented, affect & behavior appropriate  LABS/RADIOLOGY: 3-15 CBC normal, glucose 102, creatinine 1.02 otherwise CMP normal, TSH 0.477 10- 14 total protein 5.6 otherwise liver profile normal  8-14 cbc nl 7/14 creatinine 1.85, potassium 5.3 otherwise BMP normal, repeat BMP showed creatinine 1.66 otherwise BMP normal 07/2012:  Liver profile normal.    05/2012:  Creatinine 1.18, otherwise BMP normal.   04/2012:  CBC normal.  ASSESSMENT/PLAN:  Diabetes mellitus.  Well controlled.   Hypertension. Uncontrolled. Increase Cozaar 100 mg daily  Chronic kidney disease- stable.  Alzheimer's dementia.  Advanced.    Constipation.  On MiraLax.  Osteoarthritis.  No active issues.   CPT CODE: [id number]  Newton PiggGayani Y. Kerry Doryasanayaka, MD [facility] [phone]

## 2013-12-27 ENCOUNTER — Non-Acute Institutional Stay (SKILLED_NURSING_FACILITY): Payer: PRIVATE HEALTH INSURANCE | Admitting: Internal Medicine

## 2013-12-27 DIAGNOSIS — R4182 Altered mental status, unspecified: Secondary | ICD-10-CM

## 2013-12-29 NOTE — Progress Notes (Signed)
Patient ID: Heather NeighborsGussie F [name], female   DOB: 1911-05-31, [age] y.o.   MRN: [id number]           PROGRESS NOTE  DATE: 12/27/2013         [facility]  LEVEL OF CARE: SNF (31)  Acute Visit  CHIEF COMPLAINT:  Manage altered mental status.    HISTORY OF PRESENT ILLNESS: I was requested by the staff to assess the patient regarding above problem(s):  Patient's son requested to assess the patient for her lethargy.  According to son, the patient is too drowsy in the afternoon and evening.  After her morning medications, she will not eat.  Son would like medication list reviewed.  Patient is lethargic and a poor historian.    PAST MEDICAL HISTORY : Reviewed.  No changes/see problem list  CURRENT MEDICATIONS: Reviewed per MAR/see medication list  REVIEW OF SYSTEMS:  Unobtainable due to lethargy.    PHYSICAL EXAMINATION  VS: see VS section  GENERAL: no acute distress, normal body habitus EYES: conjunctivae normal, sclerae normal, normal eye lids NECK: supple, trachea midline, no neck masses, no thyroid tenderness, no thyromegaly LYMPHATICS: no LAN in the neck, no supraclavicular LAN RESPIRATORY: breathing is even & unlabored, BS CTAB CARDIAC: RRR, no murmur,no extra heart sounds, +1 bilateral lower extremity edema     GI: abdomen soft, normal BS, no masses, no tenderness, no hepatomegaly, no splenomegaly PSYCHIATRIC: the patient is alert, disoriented, affect & behavior appropriate  ASSESSMENT/PLAN:  Altered mental status.  New onset.  Significant problem.  Medications reviewed and she is not on any psychoactive medications or benzodiazepines.  We will discontinue her Ultram dose at 10 a.m.  Check BMP.    CPT CODE: [id number], [id number], [id number]          Angela CoxGayani Y Dasanayaka, MD [facility] [phone]

## 2014-01-06 ENCOUNTER — Non-Acute Institutional Stay (SKILLED_NURSING_FACILITY): Payer: PRIVATE HEALTH INSURANCE | Admitting: Internal Medicine

## 2014-01-06 DIAGNOSIS — E1129 Type 2 diabetes mellitus with other diabetic kidney complication: Secondary | ICD-10-CM

## 2014-01-06 DIAGNOSIS — G309 Alzheimer's disease, unspecified: Secondary | ICD-10-CM

## 2014-01-06 DIAGNOSIS — E1165 Type 2 diabetes mellitus with hyperglycemia: Principal | ICD-10-CM

## 2014-01-06 DIAGNOSIS — F0280 Dementia in other diseases classified elsewhere without behavioral disturbance: Secondary | ICD-10-CM

## 2014-01-06 DIAGNOSIS — N189 Chronic kidney disease, unspecified: Secondary | ICD-10-CM

## 2014-01-06 DIAGNOSIS — I150 Renovascular hypertension: Secondary | ICD-10-CM

## 2014-01-09 NOTE — Progress Notes (Signed)
Patient ID: Heather NeighborsGussie F Mchugh, female   DOB: Jul 01, 1911, [age] y.o.   MRN: [id number]         PROGRESS NOTE  DATE:  01/06/2014  [facility]   LEVEL OF CARE: SNF  Routine Visit  CHIEF COMPLAINT:  Manage chronic kidney disease, hypertension and diabetes mellitus.   HISTORY OF PRESENT ILLNESS:  REASSESSMENT OF ONGOING PROBLEM(S):  DM:pt's DM remains stable.  Staff deny polyuria, polydipsia, polyphagia, changes in vision or hypoglycemic episodes.  No complications noted from the medication presently being used.  Last hemoglobin A1c is:  5.8 in 04/2012, 6/14 hemoglobin A1c 5.6, in 12-14 hemoglobin A1c 5.8, in 3-15 hemoglobin A1c 6.2, in 6-15 hemoglobin A1c 6.1.  HTN: Pt 's HTN remains stable.  Staff deny CP, sob, DOE, pedal edema, headaches, dizziness or visual disturbances.  No complications from the medications currently being used.  Last BP : 120/70,  130/64, 120/68, 142/70, 140/70, 138/74, 122/81, 108/66, 116/60, 138/82, 142/92, 132/74, 140/84, 122/60.  CHRONIC KIDNEY DISEASE: The patient's chronic kidney disease is unstable.  staff deny increasing lower extremity swelling or confusion. Last BUN and creatinine are: 26/1.85 on 12/15/2012, in 6/14 22/1.13.  On 12-21-12 bun 32, cr 1.66, in 3-15 BUN 18, creatinine 1.08.  PAST MEDICAL HISTORY : Reviewed.  No changes.  CURRENT MED[facility]S: [facility]  REVIEW OF SYSTEMS:  Unobtainable due to dementia.   PHYSICAL EXAMINATION  VS:  See vital signs section  GENERAL: no acute distress, normal body habitus EYES: Normal sclerae, normal conjunctivae, no discharge LYMPHATICS: No cervical lymphadenopathy, no supraclavicular lymphadenopathy RESPIRATORY: breathing is even & unlabored, BS CTAB CARDIAC: RRR, no murmur,no extra heart sounds EDEMA/VARICOSITIES:  +2 bilateral lower extremity edema  ARTERIAL:  pedal pulses nonpalpable  GI: abdomen soft, normal BS, no masses, no tenderness, no hepatomegaly, no splenomegaly PSYCHIATRIC: the  patient is alert & disoriented, affect & behavior appropriate  LABS/RADIOLOGY: 3-15 CBC normal, glucose 102, creatinine 1.02 otherwise CMP normal, TSH 0.477 10- 14 total protein 5.6 otherwise liver profile normal  8-14 cbc nl 7/14 creatinine 1.85, potassium 5.3 otherwise BMP normal, repeat BMP showed creatinine 1.66 otherwise BMP normal 07/2012:  Liver profile normal.    05/2012:  Creatinine 1.18, otherwise BMP normal.   04/2012:  CBC normal.  ASSESSMENT/PLAN:  Diabetes mellitus.  Well controlled.   Hypertension. Well controlled.  Chronic kidney disease- stable.  Alzheimer's dementia.  Advanced.    Constipation.  On MiraLax.  Osteoarthritis.  No active issues.   CPT CODE: [id number]  Newton PiggGayani Y. Kerry Doryasanayaka, MD [facility] [phone]

## 2014-01-17 ENCOUNTER — Encounter: Payer: Self-pay | Admitting: *Deleted

## 2014-02-01 ENCOUNTER — Other Ambulatory Visit: Payer: Self-pay | Admitting: *Deleted

## 2014-02-01 MED ORDER — TRAMADOL HCL 50 MG PO TABS: ORAL_TABLET | ORAL | Status: AC

## 2014-02-01 NOTE — Telephone Encounter (Signed)
[facility] 

## 2014-02-07 ENCOUNTER — Non-Acute Institutional Stay (SKILLED_NURSING_FACILITY): Payer: PRIVATE HEALTH INSURANCE | Admitting: Internal Medicine

## 2014-02-07 DIAGNOSIS — I150 Renovascular hypertension: Secondary | ICD-10-CM

## 2014-02-07 DIAGNOSIS — E1129 Type 2 diabetes mellitus with other diabetic kidney complication: Secondary | ICD-10-CM

## 2014-02-07 DIAGNOSIS — N189 Chronic kidney disease, unspecified: Secondary | ICD-10-CM

## 2014-02-07 DIAGNOSIS — G309 Alzheimer's disease, unspecified: Secondary | ICD-10-CM

## 2014-02-07 DIAGNOSIS — F0280 Dementia in other diseases classified elsewhere without behavioral disturbance: Secondary | ICD-10-CM

## 2014-02-07 NOTE — Progress Notes (Signed)
Patient ID: Heather [name], female   DOB: 10-09-1911, 80 y.o.   MRN: [id number]         PROGRESS NOTE  DATE:  02/07/2014  [facility]   LEVEL OF CARE: SNF  Routine Visit  CHIEF COMPLAINT:  Manage chronic kidney disease, hypertension and diabetes mellitus.   HISTORY OF PRESENT ILLNESS:  REASSESSMENT OF ONGOING PROBLEM(S):  DM:pt's DM remains stable.  Staff deny polyuria, polydipsia, polyphagia, changes in vision or hypoglycemic episodes.  No complications noted from the medication presently being used.  Last hemoglobin A1c is:  5.8 in 04/2012, 6/14 hemoglobin A1c 5.6, in 12-14 hemoglobin A1c 5.8, in 3-15 hemoglobin A1c 6.2, in 6-15 hemoglobin A1c 6.1.  HTN: Pt 's HTN remains stable.  Staff deny CP, sob, DOE, pedal edema, headaches, dizziness or visual disturbances.  No complications from the medications currently being used.  Last BP : 120/70,  130/64, 120/68, 142/70, 140/70, 138/74, 122/81, 108/66, 116/60, 138/82, 142/92, 132/74, 140/84, 122/60, 110/72.  CHRONIC KIDNEY DISEASE: The patient's chronic kidney disease is unstable.  staff deny increasing lower extremity swelling or confusion. Last BUN and creatinine are: 26/1.85 on 12/15/2012, in 6/14 22/1.13.  On 12-21-12 bun 32, cr 1.66, in 3-15 BUN 18, creatinine 1.08, in 8-15 BUN 30, creatinine 1.19  PAST MEDICAL HISTORY : Reviewed.  No changes.  CURRENT MEDICATIONS: Reviewed per [facility]  REVIEW OF SYSTEMS:  Unobtainable due to dementia.   PHYSICAL EXAMINATION  VS:  See vital signs section  GENERAL: no acute distress, normal body habitus EYES: Normal sclerae, normal conjunctivae, no discharge LYMPHATICS: No cervical lymphadenopathy, no supraclavicular lymphadenopathy RESPIRATORY: breathing is even & unlabored, BS CTAB CARDIAC: RRR, no murmur,no extra heart sounds EDEMA/VARICOSITIES:  +2 bilateral lower extremity edema  ARTERIAL:  pedal pulses nonpalpable  GI: abdomen soft, normal BS, no masses, no tenderness, no  hepatomegaly, no splenomegaly PSYCHIATRIC: the patient is alert & disoriented, affect & behavior appropriate  LABS/RADIOLOGY: 8-15 creatinine 1.19 otherwise BMP normal 3-15 CBC normal, glucose 102, creatinine 1.02 otherwise CMP normal, TSH 0.477 10- 14 total protein 5.6 otherwise liver profile normal  8-14 cbc nl 7/14 creatinine 1.85, potassium 5.3 otherwise BMP normal, repeat BMP showed creatinine 1.66 otherwise BMP normal 07/2012:  Liver profile normal.    05/2012:  Creatinine 1.18, otherwise BMP normal.   04/2012:  CBC normal.  ASSESSMENT/PLAN:  Diabetes mellitus.  Well controlled.   Hypertension. Well controlled.  Chronic kidney disease- stable.  Alzheimer's dementia.  Advanced.    Constipation.  On MiraLax.  Osteoarthritis.  No active issues.   Check CBC and CMP  CPT CODE: 40981  Newton Pigg. Kerry Dory, MD [facility] [phone]

## 2014-02-28 ENCOUNTER — Non-Acute Institutional Stay (SKILLED_NURSING_FACILITY): Payer: PRIVATE HEALTH INSURANCE | Admitting: Internal Medicine

## 2014-02-28 DIAGNOSIS — R5383 Other fatigue: Secondary | ICD-10-CM

## 2014-03-03 NOTE — Progress Notes (Signed)
Patient ID: Heather [name], female   DOB: 1911/08/01, [age] y.o.   MRN: [id number]           PROGRESS NOTE  DATE: 02/28/2014          [facility]  LEVEL OF CARE: SNF (31)  Acute Visit  CHIEF COMPLAINT:  Manage lethargy.    HISTORY OF PRESENT ILLNESS: I was requested by the staff to assess the patient regarding above problem(s):  Patient's son has requested that her medications be evaluated.  He feels that the patient is lethargic and sleeping all the time secondary to the medications.    Due to advanced dementia, the patient does not follow commands.    PAST MEDICAL HISTORY : Reviewed.  No changes/see problem list  CURRENT MEDICATIONS: Reviewed per MAR/see medication list  REVIEW OF SYSTEMS:  Unobtainable due to dementia.     PHYSICAL EXAMINATION  VS: see VS section  GENERAL: no acute distress, normal body habitus NECK: supple, trachea midline, no neck masses, no thyroid tenderness, no thyromegaly RESPIRATORY: breathing is even & unlabored, BS CTAB CARDIAC: RRR, no murmur,no extra heart sounds, +1 bilateral lower extremity edema      GI: abdomen soft, normal BS, no masses, no tenderness, no hepatomegaly, no splenomegaly PSYCHIATRIC: the patient is alert, disoriented, affect & behavior appropriate  LABS/RADIOLOGY:  12/2013:  Creatinine 1.19, glucose 103, otherwise BMP normal.     ASSESSMENT/PLAN:  Lethargy.  Patient clinically is in her normal state.  I do not observe any lethargy.  Her medication list is reviewed.  The only medications that could contribute to lethargy would be Ultram which is only given at 10 p.m.  We will obtain a liver profile and CBC.      CPT CODE: 4098199308, [id number]                Angela CoxGayani Y Jamair Cato, MD [facility] [phone]

## 2014-03-07 ENCOUNTER — Non-Acute Institutional Stay (SKILLED_NURSING_FACILITY): Payer: PRIVATE HEALTH INSURANCE | Admitting: Internal Medicine

## 2014-03-07 DIAGNOSIS — I150 Renovascular hypertension: Secondary | ICD-10-CM

## 2014-03-07 DIAGNOSIS — E1122 Type 2 diabetes mellitus with diabetic chronic kidney disease: Secondary | ICD-10-CM

## 2014-03-07 DIAGNOSIS — N189 Chronic kidney disease, unspecified: Secondary | ICD-10-CM

## 2014-03-08 DIAGNOSIS — E119 Type 2 diabetes mellitus without complications: Secondary | ICD-10-CM | POA: Insufficient documentation

## 2014-03-08 NOTE — Progress Notes (Signed)
Patient ID: Heather [name], female   DOB: 16-Aug-1911, [age] y.o.   MRN: [id number]         PROGRESS NOTE  DATE:  03/07/2014  [facility]   LEVEL OF CARE: SNF  Routine Visit  CHIEF COMPLAINT:  Manage chronic kidney disease, hypertension and diabetes mellitus.   HISTORY OF PRESENT ILLNESS:  REASSESSMENT OF ONGOING PROBLEM(S):  DM:pt's DM remains stable.  Staff deny polyuria, polydipsia, polyphagia, changes in vision or hypoglycemic episodes.  No complications noted from the medication presently being used.  Last hemoglobin A1c is:  5.8 in 04/2012, 6/14 hemoglobin A1c 5.6, in 12-14 hemoglobin A1c 5.8, in 3-15 hemoglobin A1c 6.2, in 6-15 hemoglobin A1c 6.1.  HTN: Pt 's HTN remains stable.  Staff deny CP, sob, DOE, pedal edema, headaches, dizziness or visual disturbances.  No complications from the medications currently being used.  Last BP : 120/70,  130/64, 120/68, 142/70, 140/70, 138/74, 122/81, 108/66, 116/60, 138/82, 142/92, 132/74, 140/84, 122/60, 110/72, 150/80.  CHRONIC KIDNEY DISEASE: The patient's chronic kidney disease is unstable.  staff deny increasing lower extremity swelling or confusion. Last BUN and creatinine are: 26/1.85 on 12/15/2012, in 6/14 22/1.13.  On 12-21-12 bun 32, cr 1.66, in 3-15 BUN 18, creatinine 1.08, in 8-15 BUN 30, creatinine 1.19  PAST MEDICAL HISTORY : Reviewed.  No changes.  CURRENT MEDICATIONS: [facility]  REVIEW OF SYSTEMS:  Unobtainable due to dementia.   PHYSICAL EXAMINATION  VS:  See vital signs section  GENERAL: no acute distress, normal body habitus EYES: Normal sclerae, normal conjunctivae, no discharge LYMPHATICS: No cervical lymphadenopathy, no supraclavicular lymphadenopathy RESPIRATORY: breathing is even & unlabored, BS CTAB CARDIAC: RRR, no murmur,no extra heart sounds EDEMA/VARICOSITIES:  +1 bilateral lower extremity edema  ARTERIAL:  pedal pulses nonpalpable  GI: abdomen soft, normal BS, no masses, no tenderness, no  hepatomegaly, no splenomegaly PSYCHIATRIC: the patient is alert & disoriented, affect & behavior appropriate  LABS/RADIOLOGY: 8-15 creatinine 1.19 otherwise BMP normal 3-15 CBC normal, glucose 102, creatinine 1.02 otherwise CMP normal, TSH 0.477 10- 14 total protein 5.6 otherwise liver profile normal  8-14 cbc nl 7/14 creatinine 1.85, potassium 5.3 otherwise BMP normal, repeat BMP showed creatinine 1.66 otherwise BMP normal 07/2012:  Liver profile normal.    05/2012:  Creatinine 1.18, otherwise BMP normal.   04/2012:  CBC normal.  ASSESSMENT/PLAN:  Diabetes mellitus.  Well controlled.   Hypertension. Blood pressure elevated. Will monitor  Chronic kidney disease- stable.  Alzheimer's dementia.  Advanced.    Constipation.  On MiraLax.  Osteoarthritis.  No active issues.   Check CBC and CMP  CPT CODE: [id number]  Heather PiggGayani Y. Kerry Doryasanayaka, MD [facility] [phone]

## 2014-03-14 ENCOUNTER — Non-Acute Institutional Stay (SKILLED_NURSING_FACILITY): Payer: PRIVATE HEALTH INSURANCE | Admitting: Internal Medicine

## 2014-03-14 DIAGNOSIS — N179 Acute kidney failure, unspecified: Secondary | ICD-10-CM

## 2014-03-14 DIAGNOSIS — N390 Urinary tract infection, site not specified: Secondary | ICD-10-CM

## 2014-03-16 NOTE — Progress Notes (Signed)
Patient ID: Heather [name], female   DOB: 1911/11/28, [age] y.o.   MRN: [id number]           PROGRESS NOTE  DATE: 03/14/2014          [facility]  LEVEL OF CARE: SNF (31)  Acute Visit  CHIEF COMPLAINT:  Manage UTI and acute renal failure.    HISTORY OF PRESENT ILLNESS: I was requested by the staff to assess the patient regarding above problem(s):  UTI:  On 03/09/2014:  Patient's urine culture grew Enterococcus species significantly.  Patient is a poor historian.  Staff do not report increasing confusion, suprapubic pain, or hematuria.    ACUTE RENAL FAILURE:  On 03/10/2014:  BUN 23, creatinine 1.34.  In 12/2013:  Creatinine 1.19.    PAST MEDICAL HISTORY : Reviewed.  No changes/see problem list  CURRENT MEDICATIONS: Reviewed per MAR/see medication list  REVIEW OF SYSTEMS:  Unobtainable due to dementia.    PHYSICAL EXAMINATION  VS: see VS section  GENERAL: no acute distress, normal body habitus EYES: conjunctivae normal, sclerae normal, normal eye lids NECK: supple, trachea midline, no neck masses, no thyroid tenderness, no thyromegaly LYMPHATICS: no LAN in the neck, no supraclavicular LAN RESPIRATORY: breathing is even & unlabored, BS CTAB CARDIAC: RRR, no murmur,no extra heart sounds, no edema GI: abdomen soft, normal BS, no masses, no tenderness, no hepatomegaly, no splenomegaly PSYCHIATRIC: the patient is alert, disoriented, affect & behavior appropriate  ASSESSMENT/PLAN:  UTI.  New problem.  Start Levaquin 250 mg q.d. for seven days and probiotics b.i.d. for 10 days.    Acute renal failure.  Worsening problem.  Recheck renal functions on 03/18/2014.    CPT CODE: [id number], [id number], [id number]         Angela CoxGayani Y Pheonix Clinkscale, MD [facility] [phone]

## 2014-03-23 ENCOUNTER — Non-Acute Institutional Stay (SKILLED_NURSING_FACILITY): Payer: PRIVATE HEALTH INSURANCE | Admitting: Internal Medicine

## 2014-03-23 DIAGNOSIS — N189 Chronic kidney disease, unspecified: Secondary | ICD-10-CM

## 2014-03-24 NOTE — Progress Notes (Signed)
Patient ID: Heather NeighborsGussie F [name], female   DOB: 12/24/1911, [age] y.o.   MRN: [id number]           PROGRESS NOTE  DATE: 03/23/2014         FACILITY[facility]  LEVEL OF CARE: SNF (31)  Acute Visit  CHIEF COMPLAINT:  Manage chronic kidney disease.    HISTORY OF PRESENT ILLNESS: I was requested by the staff to assess the patient regarding above problem(s):  CHRONIC KIDNEY DISEASE: The patient's chronic kidney disease remains stable.  Staff denies increasing lower extremity swelling or confusion. Last BUN and creatinine are:   On 03/18/2014:  BUN 21, creatinine 1.16.  In 12/2013:  Creatinine 1.19.  Patient is a poor historian due to dementia.    PAST MEDICAL HISTORY : Reviewed.  No changes/see problem list  CURRENT MEDICATIONS: Reviewed per MAR/see medication list  PHYSICAL EXAMINATION  VS: see VS section  GENERAL: no acute distress, normal body habitus RESPIRATORY: breathing is even & unlabored, BS CTAB CARDIAC: RRR, no murmur,no extra heart sounds, +1 bilateral lower extremity edema       ASSESSMENT/PLAN:  Chronic kidney disease.  Renal functions improved.    CPT CODE: [id number]          Angela CoxGayani Y Joelle Roswell, MD [facility] [phone]

## 2016-04-08 ENCOUNTER — Encounter (HOSPITAL_COMMUNITY): Payer: Self-pay | Admitting: Emergency Medicine

## 2016-04-08 ENCOUNTER — Emergency Department (HOSPITAL_COMMUNITY): Payer: Medicare Other

## 2016-04-08 ENCOUNTER — Emergency Department (HOSPITAL_COMMUNITY)
Admission: EM | Admit: 2016-04-08 | Discharge: 2016-04-09 | Disposition: A | Discharge status: Home / Self Care | Payer: Medicare Other | Attending: Emergency Medicine | Admitting: Emergency Medicine

## 2016-04-08 DIAGNOSIS — Z79899 Other long term (current) drug therapy: Secondary | ICD-10-CM | POA: Insufficient documentation

## 2016-04-08 DIAGNOSIS — I129 Hypertensive chronic kidney disease with stage 1 through stage 4 chronic kidney disease, or unspecified chronic kidney disease: Secondary | ICD-10-CM | POA: Diagnosis not present

## 2016-04-08 DIAGNOSIS — N3000 Acute cystitis without hematuria: Secondary | ICD-10-CM | POA: Insufficient documentation

## 2016-04-08 DIAGNOSIS — G309 Alzheimer's disease, unspecified: Secondary | ICD-10-CM | POA: Diagnosis not present

## 2016-04-08 DIAGNOSIS — R630 Anorexia: Secondary | ICD-10-CM | POA: Insufficient documentation

## 2016-04-08 DIAGNOSIS — N189 Chronic kidney disease, unspecified: Secondary | ICD-10-CM | POA: Diagnosis not present

## 2016-04-08 DIAGNOSIS — E1122 Type 2 diabetes mellitus with diabetic chronic kidney disease: Secondary | ICD-10-CM | POA: Insufficient documentation

## 2016-04-08 LAB — COMPREHENSIVE METABOLIC PANEL
ALBUMIN: 3.8 g/dL (ref 3.5–5.0)
ALK PHOS: 65 U/L (ref 38–126)
ALT: 16 U/L (ref 14–54)
ANION GAP: 7 (ref 5–15)
AST: 27 U/L (ref 15–41)
BILIRUBIN TOTAL: 1 mg/dL (ref 0.3–1.2)
BUN: 20 mg/dL (ref 6–20)
CALCIUM: 9.5 mg/dL (ref 8.9–10.3)
CO2: 23 mmol/L (ref 22–32)
Chloride: 109 mmol/L (ref 101–111)
Creatinine, Ser: 1.02 mg/dL — ABNORMAL HIGH (ref 0.44–1.00)
GFR calc Af Amer: 50 mL/min — ABNORMAL LOW (ref >60)
GFR calc non Af Amer: 43 mL/min — ABNORMAL LOW (ref >60)
GLUCOSE: 85 mg/dL (ref 65–99)
Potassium: 4.8 mmol/L (ref 3.5–5.1)
SODIUM: 139 mmol/L (ref 135–145)
TOTAL PROTEIN: 7.1 g/dL (ref 6.5–8.1)

## 2016-04-08 LAB — CBC WITH DIFFERENTIAL/PLATELET
BASOS ABS: 0 10*3/uL (ref 0.0–0.1)
BASOS PCT: 1 %
EOS ABS: 0.2 10*3/uL (ref 0.0–0.7)
Eosinophils Relative: 6 %
HEMATOCRIT: 43.8 % (ref 36.0–46.0)
HEMOGLOBIN: 15.1 g/dL — AB (ref 12.0–15.0)
LYMPHS ABS: 2.6 10*3/uL (ref 0.7–4.0)
LYMPHS PCT: 61 %
MCH: 31.1 pg (ref 26.0–34.0)
MCHC: 34.5 g/dL (ref 30.0–36.0)
MCV: 90.1 fL (ref 78.0–100.0)
MONOS PCT: 9 %
Monocytes Absolute: 0.4 10*3/uL (ref 0.1–1.0)
NEUTROS ABS: 0.9 10*3/uL — AB (ref 1.7–7.7)
Neutrophils Relative %: 23 %
Platelets: 140 10*3/uL — ABNORMAL LOW (ref 150–400)
RBC: 4.86 MIL/uL (ref 3.87–5.11)
RDW: 14.9 % (ref 11.5–15.5)
WBC: 4.1 10*3/uL (ref 4.0–10.5)

## 2016-04-08 LAB — URINALYSIS, ROUTINE W REFLEX MICROSCOPIC
BILIRUBIN URINE: NEGATIVE
Glucose, UA: NEGATIVE mg/dL
HGB URINE DIPSTICK: NEGATIVE
KETONES UR: NEGATIVE mg/dL
Nitrite: POSITIVE — AB
PH: 6 (ref 5.0–8.0)
Protein, ur: NEGATIVE mg/dL
SPECIFIC GRAVITY, URINE: 1.016 (ref 1.005–1.030)

## 2016-04-08 LAB — URINE MICROSCOPIC-ADD ON: RBC / HPF: NONE SEEN RBC/hpf (ref 0–5)

## 2016-04-08 LAB — TROPONIN I: Troponin I: <0.03 ng/mL (ref <0.03)

## 2016-04-08 MED ORDER — CEFTRIAXONE SODIUM 1 G IJ SOLR
1.0000 g | Freq: Once | INTRAMUSCULAR | Status: AC
  Administered 2016-04-08: 1 g via INTRAVENOUS
  Filled 2016-04-08: qty 10

## 2016-04-08 MED ORDER — CEPHALEXIN 500 MG PO CAPS: 1000.0000 mg | ORAL_CAPSULE | Freq: Two times a day (BID) | ORAL | 0 refills | Status: AC

## 2016-04-08 MED ORDER — SODIUM CHLORIDE 0.9 % IV BOLUS (SEPSIS)
500.0000 mL | Freq: Once | INTRAVENOUS | Status: AC
  Administered 2016-04-08: 500 mL via INTRAVENOUS

## 2016-04-08 MED ORDER — CEFTRIAXONE SODIUM 1 G IJ SOLR
1.0000 g | Freq: Once | INTRAMUSCULAR | Status: DC
  Filled 2016-04-08: qty 10

## 2016-04-08 MED ORDER — CEPHALEXIN 500 MG PO CAPS: 1000.0000 mg | ORAL_CAPSULE | Freq: Once | ORAL | Status: DC

## 2016-04-08 NOTE — ED Provider Notes (Addendum)
[facility]-EMERGENCY DEPT Provider Note   CSN: [id number] Arrival date & time: 04/08/16  1539     History   Chief Complaint Chief Complaint  Patient presents with  . Nasal Congestion  . Decreased Appetite    HPI Heather [name] is a 82 y.o. female.  HPI Hx is per nursing staff and EMS. Patient has dementia and at baseline is noncommunicative and mostly keeps arms firmly across her chest and stays in bed. Family (not currently in the ED) reported concerns of patient  Having "congestion" and loss of appetite. Reportedly, the  Patient was seen by the Nurse Practitioner at the facility and nothing acute was identified. Family reportedly still had concerns and requested the patient be seen in the ED.     Past Medical History:  Diagnosis Date  . Diabetes mellitus without complication (HCC)   . Hypertension   . Osteoarthritis   . Renal insufficiency     Patient Active Problem List   Diagnosis Date Noted  . Diabetes (HCC) 03/08/2014  . 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 Unspecified constipation 01/20/2013  . Urinary tract infection, site not specified 01/01/2013  . Type II or unspecified type diabetes mellitus with renal manifestations, not stated as uncontrolled(250.40) 12/17/2012  . 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 Chronic kidney disease 11/24/2012  . Essential hypertension, benign 10/02/2012  . Alzheimer's disease 10/02/2012  . Hypopotassemia 10/02/2012  . ARTHRALGIA UNSPECIFIED SITE 08/23/2008  . DEGENERATIVE JOINT DISEASE 11/17/2007  . EDEMA 11/17/2007  . DIABETES MELLITUS 06/12/2007  . HYPERTENSION 06/12/2007  . RENAL INSUFFICIENCY 06/12/2007    Past Surgical History:  Procedure Laterality Date  . TUMOR REMOVAL     Abdomen    OB History    No data available       Home Medications    Prior to Admission medications   Medication Sig Start Date End Date Taking? Authorizing  Provider  acetaminophen (TYLENOL) 500 MG tablet Take 500 mg by mouth 2 (two) times daily.   Yes Historical Provider, MD  Artificial Tear Ointment (AKWA TEARS OP) Apply 2 drops to eye 4 (four) times daily. Instill 2 drops in each eye four times daily for dry eyes. * Wait 3-5 minutes between 2 eye medications*.   Yes Historical Provider, MD  furosemide (LASIX) 20 MG tablet Take 1/2 tablet = 10 mg by mouth daily   Yes Historical Provider, MD  loratadine (CLARITIN) 10 MG tablet Take 10 mg by mouth daily.   Yes Historical Provider, MD  losartan (COZAAR) 100 MG tablet Take 1 tablet by mouth daily   Yes Historical Provider, MD  magic mouthwash SOLN Take 5 mLs by mouth 3 (three) times daily.   Yes Historical Provider, MD  pramoxine (SARNA SENSITIVE) 1 % LOTN Apply 1 application topically 2 (two) times daily.   Yes Historical Provider, MD  Vitamin D, Cholecalciferol, 1000 units TABS Take 3 tablets by mouth daily.   Yes Historical Provider, MD  cephALEXin (KEFLEX) 500 MG capsule Take 2 capsules (1,000 mg total) by mouth 2 (two) times daily. 04/08/16   Arby Barrette, MD  Cholecalciferol (VITAMIN D3) 50000 UNITS CAPS Take 1 capsule by mouth every 30 (thirty) days. Take on the 15 th of the month.    Historical Provider, MD  polyethylene glycol (MIRALAX / GLYCOLAX) packet Take 17 g by mouth daily. Mix 17 gm in 4-8 oz of liquid daily.    Historical Provider, MD  pseudoephedrine (SUDAFED) 30  MG tablet Take 30 mg by mouth every 6 (six) hours as needed for congestion.    Historical Provider, MD  senna (SENOKOT) 8.6 MG tablet Take 2 tablets by mouth 2 (two) times daily. Constipation may hold for loose stools.    Historical Provider, MD  traMADol Janean Sark(ULTRAM) 50 MG tablet Take two tablets by mouth every evening for pain Patient not taking: Reported on 04/08/2016 02/01/14   Oneal GroutMahima Pandey, MD    Family History History reviewed. No pertinent family history.  Social History Social History  Substance Use Topics  . Smoking  status: Never Smoker  . Smokeless tobacco: Never Used  . Alcohol use No     Allergies   Hydrochlorothiazide   Review of Systems Review of Systems Level V caveat dementia, cannot obtain ROS.  Physical Exam Updated Vital Signs BP 137/83 (BP Location: Right Arm)   Pulse 60   Resp 17   SpO2 100%   Physical Exam  Constitutional:  Patient is very elderly and somewhat deconditioned but not acutely ill in appearance. No respiratory distress.  HENT:  Head: Normocephalic and atraumatic.  Right Ear: External ear normal.  Left Ear: External ear normal.  Nose: Nose normal.  Mouth/Throat: Oropharynx is clear and moist.  Eyes:  Currently patient wont open eyes, firmly squeezes shut with attempt to manually open.  Cardiovascular: Normal rate, regular rhythm, normal heart sounds and intact distal pulses.   Pulmonary/Chest: Effort normal and breath sounds normal.  Abdominal: Soft. Bowel sounds are normal. She exhibits no distension. There is no tenderness. There is no guarding.  Musculoskeletal: She exhibits no edema or deformity.  Patient keeps extremities fairly tightly flexed and resists straightening. No peripheral edema. No cellulitis.  Neurological:  Patient is awake and followed command to open her mouth by protruding her tongue. Motor intact x 4 extremities (patient resists straightening with good motor tone).   Skin: Skin is warm and dry. No rash noted. No erythema.     ED Treatments / Results  Labs (all labs ordered are listed, but only abnormal results are displayed) Labs Reviewed  COMPREHENSIVE METABOLIC PANEL - Abnormal; Notable for the following:       Result Value   Creatinine, Ser 1.02 (*)    GFR calc non Af Amer 43 (*)    GFR calc Af Amer 50 (*)    All other components within normal limits  CBC WITH DIFFERENTIAL/PLATELET - Abnormal; Notable for the following:    Hemoglobin 15.1 (*)    Platelets 140 (*)    Neutro Abs 0.9 (*)    All other components within normal  limits  URINALYSIS, ROUTINE W REFLEX MICROSCOPIC (NOT AT [facility]) - Abnormal; Notable for the following:    APPearance CLOUDY (*)    Nitrite POSITIVE (*)    Leukocytes, UA SMALL (*)    All other components within normal limits  URINE MICROSCOPIC-ADD ON - Abnormal; Notable for the following:    Squamous Epithelial / LPF 0-5 (*)    Bacteria, UA FEW (*)    All other components within normal limits  URINE CULTURE  TROPONIN I  PATHOLOGIST SMEAR REVIEW    EKG  EKG Interpretation None       Radiology Dg Chest Port 1 View  Result Date: 04/08/2016 CLINICAL DATA:  Decreased oral intake. Altered mental status. Constantly clearing throat. History of hypertension and diabetes and renal insufficiency. EXAM: PORTABLE CHEST 1 VIEW COMPARISON:  Chest x-ray of November 17, 2007 FINDINGS: The lungs are adequately inflated.  There is no focal infiltrate. The interstitial markings are mildly prominent in the upper lobes but this is not entirely new. The patient is quite rotated on the study which distorts the anatomy. The heart is normal in size. The pulmonary vascularity is not clearly engorged. There is calcification in the wall of the aortic arch. There is severe degenerative change of the left shoulder. IMPRESSION: No definite pneumonia nor CHF nor other acute cardiopulmonary abnormality. Electronically Signed   By: David  SwazilandJordan M.D.   On: 04/08/2016 17:03    Procedures Procedures (including critical care time)  Medications Ordered in ED Medications  cephALEXin (KEFLEX) capsule 1,000 mg (not administered)     Initial Impression / Assessment and Plan / ED Course  I have reviewed the triage vital signs and the nursing notes.  Pertinent labs & imaging results that were available during my care of the patient were reviewed by me and considered in my medical decision making (see chart for details).  Clinical Course     Final Clinical Impressions(s) / ED Diagnoses   Final diagnoses:  Acute  cystitis without hematuria  Anorexia  Patient has been taking in oral fluids in the emergency department. Family members report she has been conversant with them. At this time urinalysis does show signs of UTI. Vital signs and labs do not indicate dehydration or acute malnutrition. Plan will be to initiate antibiotics for UTI with close follow-up with PCP provider.  Patient's family member reports that she has a tendency sometimes to pocket her medications in her mouth and try not to take them. We will give one IM dose of Rocephin for UTI and then patient will be initiating medications in 24 hours by mouth. She is taking applesauce and sips of water here. New Prescriptions New Prescriptions   CEPHALEXIN (KEFLEX) 500 MG CAPSULE    Take 2 capsules (1,000 mg total) by mouth 2 (two) times daily.     Arby BarretteMarcy Namine Beahm, MD 04/08/16 1921    Arby BarretteMarcy Alla Sloma, MD 04/08/16 [phone]

## 2016-04-08 NOTE — ED Notes (Signed)
Attempted blood draw. Unsuccessful 

## 2016-04-08 NOTE — ED Notes (Signed)
Bed: WA08 Expected date:  Expected time:  Means of arrival:  Comments: EMS- 89y F, SOB?

## 2016-04-08 NOTE — ED Notes (Signed)
I attempted to collect labs and was unsuccessful. 

## 2016-04-08 NOTE — ED Notes (Signed)
[facility]

## 2016-04-08 NOTE — ED Triage Notes (Signed)
Per EMS pt brought in per family request. [facility] Family reports decreased appetite and congestion. Pt is non verbal. NAD noted.

## 2016-04-09 LAB — PATHOLOGIST SMEAR REVIEW

## 2016-04-11 LAB — URINE CULTURE: Culture: >=100000 — AB

## 2016-04-12 ENCOUNTER — Telehealth (HOSPITAL_COMMUNITY): Payer: Self-pay

## 2016-04-12 NOTE — Progress Notes (Signed)
ED Antimicrobial Stewardship Positive Culture Follow Up   Wende NeighborsGussie F [name] is an [age] y.o. female who [facility] with a chief complaint of  Chief Complaint  Patient presents with  . Nasal Congestion  . Decreased Appetite    Recent Results (from the past 720 hour(s))  Urine culture     Status: Abnormal   Collection Time: 04/08/16  4:16 PM  Result Value Ref Range Status   Specimen Description URINE, CATHETERIZED  Final   Special Requests NONE  Final   Culture (A)  Final    >=100,000 COLONIES/mL ESCHERICHIA COLI >=100,000 COLONIES/mL KLEBSIELLA PNEUMONIAE    Report Status 04/11/2016 FINAL  Final   Organism ID, Bacteria ESCHERICHIA COLI (A)  Final   Organism ID, Bacteria KLEBSIELLA PNEUMONIAE (A)  Final      Susceptibility   Escherichia coli - MIC*    AMPICILLIN >=32 RESISTANT Resistant     CEFAZOLIN 32 INTERMEDIATE Intermediate     CEFTRIAXONE <=1 SENSITIVE Sensitive     CIPROFLOXACIN >=4 RESISTANT Resistant     GENTAMICIN <=1 SENSITIVE Sensitive     IMIPENEM <=0.25 SENSITIVE Sensitive     NITROFURANTOIN <=16 SENSITIVE Sensitive     TRIMETH/SULFA <=20 SENSITIVE Sensitive     AMPICILLIN/SULBACTAM >=32 RESISTANT Resistant     PIP/TAZO 8 SENSITIVE Sensitive     Extended ESBL NEGATIVE Sensitive     * >=100,000 COLONIES/mL ESCHERICHIA COLI   Klebsiella pneumoniae - MIC*    AMPICILLIN 16 RESISTANT Resistant     CEFAZOLIN <=4 SENSITIVE Sensitive     CEFTRIAXONE <=1 SENSITIVE Sensitive     CIPROFLOXACIN <=0.25 SENSITIVE Sensitive     GENTAMICIN <=1 SENSITIVE Sensitive     IMIPENEM <=0.25 SENSITIVE Sensitive     NITROFURANTOIN <=16 SENSITIVE Sensitive     TRIMETH/SULFA <=20 SENSITIVE Sensitive     AMPICILLIN/SULBACTAM 4 SENSITIVE Sensitive     PIP/TAZO <=4 SENSITIVE Sensitive     Extended ESBL NEGATIVE Sensitive     * >=100,000 COLONIES/mL KLEBSIELLA PNEUMONIAE    [x]  Treated with Keflex, organism resistant to prescribed antimicrobial  New antibiotic  prescription: Fosfomycin 3g po x 1 dose  ED Provider: Harolyn RutherfordShawn Joy, PA  Allie BossierApryl Anderson, PharmD PGY1 Pharmacy Resident [phone]) 04/12/2016 8:47 AM

## 2016-04-12 NOTE — Telephone Encounter (Signed)
Post ED Visit - Positive Culture Follow-up: Successful Patient Follow-Up  Culture assessed and recommendations reviewed by: []  Enzo BiNathan Batchelder, Pharm.D. []  Celedonio MiyamotoJeremy Frens, Pharm.D., BCPS []  Garvin FilaMike Maccia, Pharm.D. []  Georgina PillionElizabeth Martin, Pharm.D., BCPS []  [place] Grove[place]h Pham, [address]Pharm.D., BCPS, AAHIVP []  Estella HuskMichelle Turner, Pharm.D., BCPS, AAHIVP []  Tennis Mustassie Stewart, Pharm.D. []  Sherle Poeob Vincent, [address]Pharm[address] Ladona HornsX  Apryl Anderson, Pharm.D.   Positive urine culture,>/= 100,000 colonies -> E Coli & Klebsiella Pneumoniae  []  Patient discharged without antimicrobial prescription and treatment is now indicated [x]  Organism is resistant to prescribed ED discharge antimicrobial(Cephalexin) []  Patient with positive blood cultures  Changes discussed with ED provider: Harolyn RutherfordShawn Joy PA New antibiotic prescription " Fosfomycin 3g x 1 dose" Called to   Contacted patient, date 04/12/16, time 1202 Results faxed to [facility] [phone], Harland DingwallAttn Vangie.   Arvid RightClark, Quintasia Theroux Dorn 04/12/2016, 12:24 PM

## 2016-10-25 DEATH — deceased

## 2017-11-30 IMAGING — DX DG CHEST 1V PORT
2 series · 2 of 2 positions shown · non-contrast
Comparison: Chest x-ray of November 17, 2007

CLINICAL DATA: Decreased oral intake. Altered mental status.
Constantly clearing throat. History of hypertension and diabetes and
renal insufficiency.

EXAM:
PORTABLE CHEST 1 VIEW

[chest ap (1 of 2)]
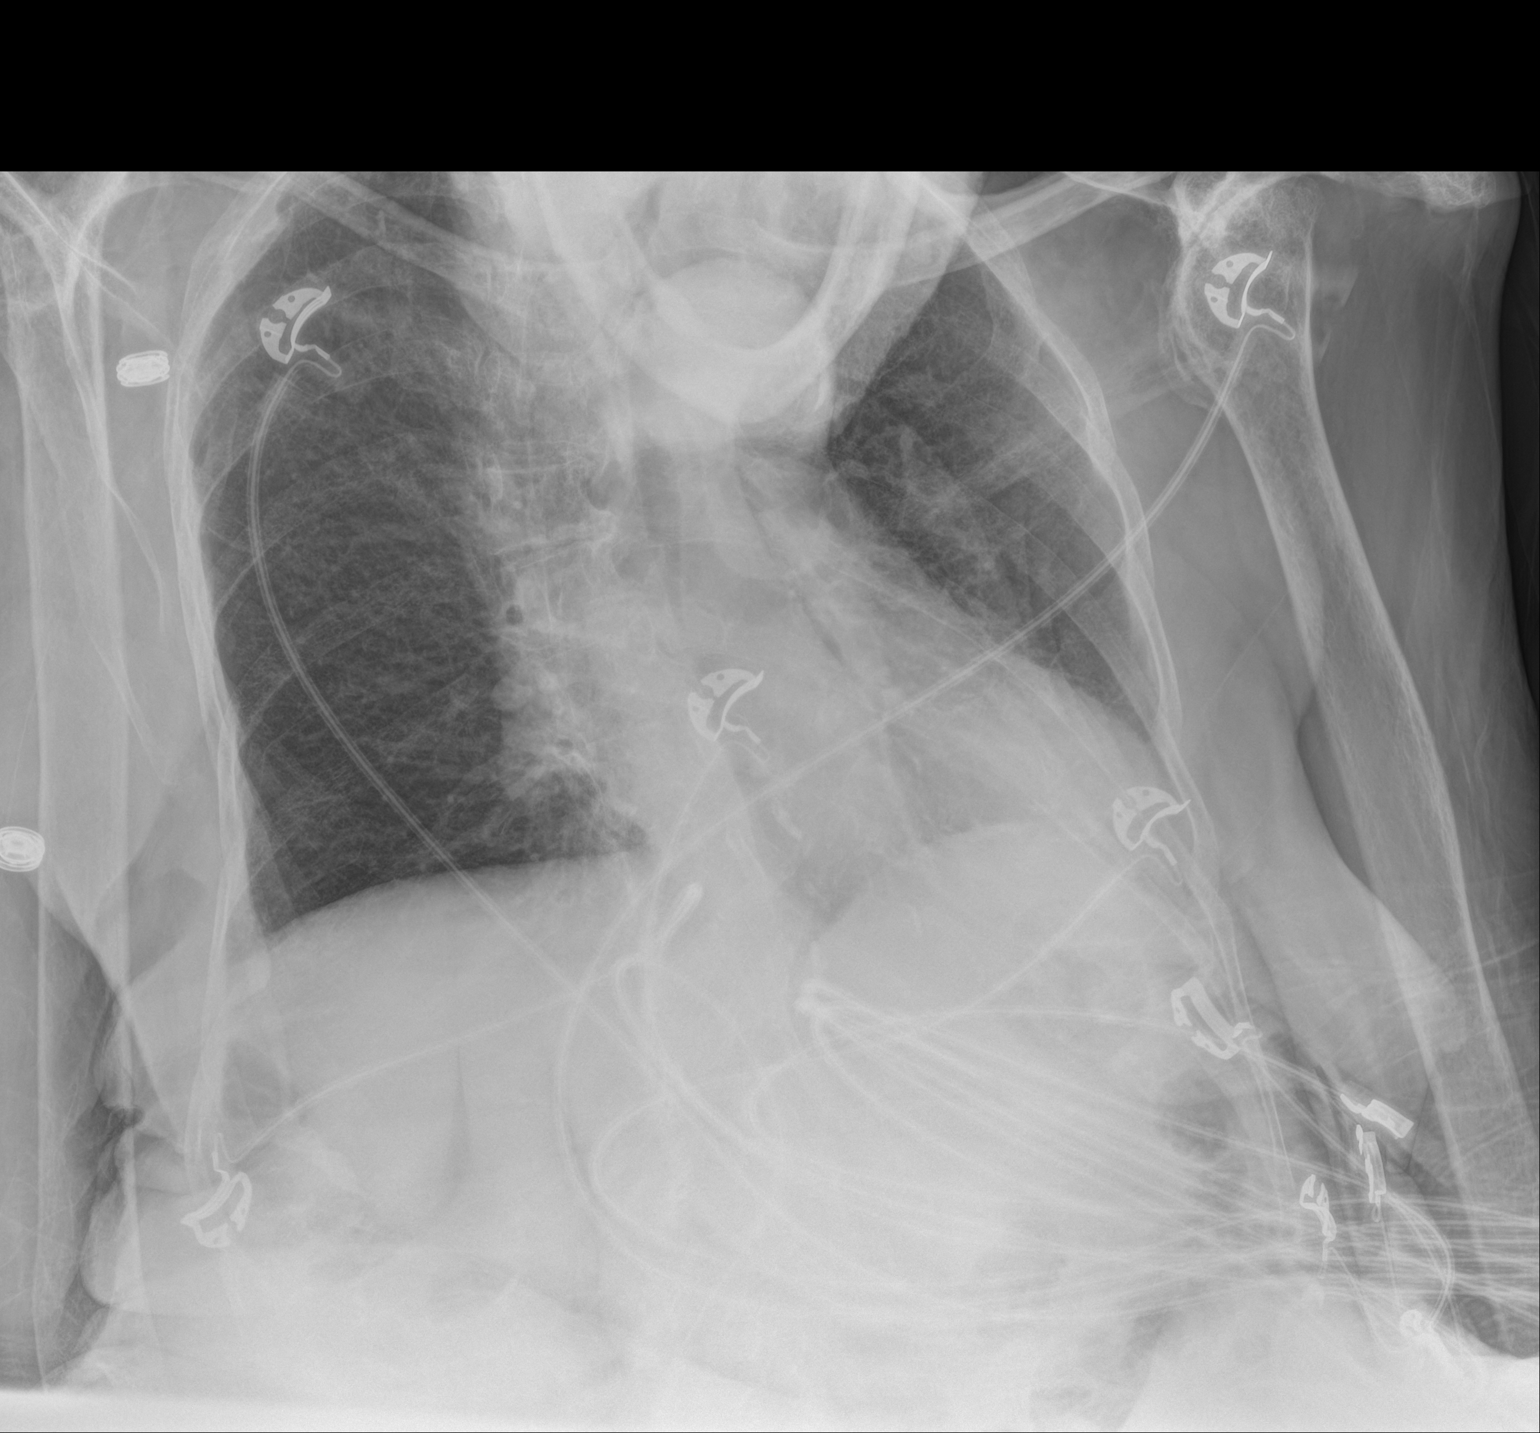

[chest ap (2 of 2)]
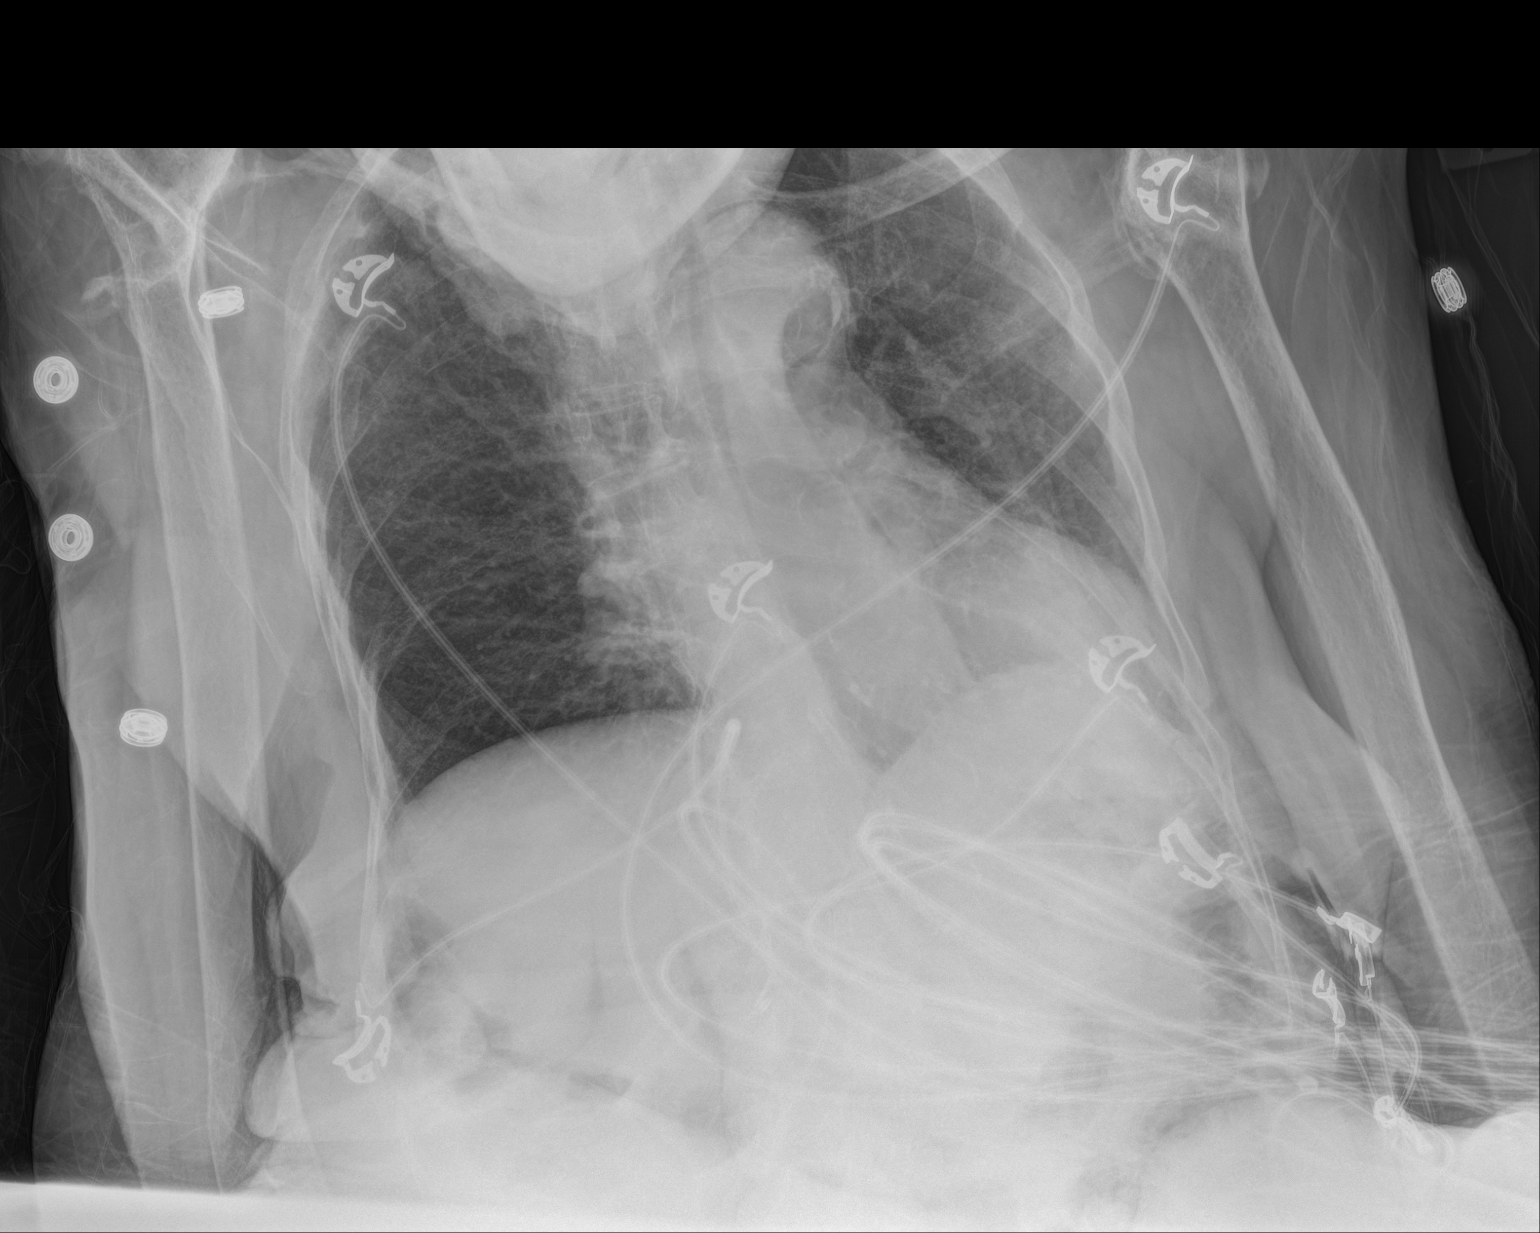

[2 of 2 positions shown; findings below may reference images not displayed]

FINDINGS: The lungs are adequately inflated. There is no focal infiltrate. The
interstitial markings are mildly prominent in the upper lobes but
this is not entirely new. The patient is quite rotated on the study
which distorts the anatomy. The heart is normal in size. The
pulmonary vascularity is not clearly engorged. There is
calcification in the wall of the aortic arch. There is severe
degenerative change of the left shoulder.
IMPRESSION: No definite pneumonia nor CHF nor other acute cardiopulmonary
abnormality.
